# Patient Record
Sex: Female | Born: 1960 | Race: White | Hispanic: No | Marital: Married | Smoking: Former smoker
Health system: Southern US, Community
[De-identification: ages and names within clinical notes are randomized; demographics above are authoritative.]

## PROBLEM LIST (undated history)

## (undated) DIAGNOSIS — Q2730 Arteriovenous malformation, site unspecified: Secondary | ICD-10-CM

## (undated) DIAGNOSIS — R002 Palpitations: Secondary | ICD-10-CM

## (undated) DIAGNOSIS — B001 Herpesviral vesicular dermatitis: Secondary | ICD-10-CM

## (undated) DIAGNOSIS — K644 Residual hemorrhoidal skin tags: Secondary | ICD-10-CM

## (undated) DIAGNOSIS — A5131 Condyloma latum: Secondary | ICD-10-CM

## (undated) DIAGNOSIS — K589 Irritable bowel syndrome without diarrhea: Secondary | ICD-10-CM

## (undated) DIAGNOSIS — E079 Disorder of thyroid, unspecified: Secondary | ICD-10-CM

## (undated) HISTORY — DX: Palpitations: R00.2

## (undated) HISTORY — DX: Residual hemorrhoidal skin tags: K64.4

## (undated) HISTORY — DX: Arteriovenous malformation, site unspecified: Q27.30

## (undated) HISTORY — PX: HEMORRHOID SURGERY: SHX153

## (undated) HISTORY — DX: Condyloma latum: A51.31

## (undated) HISTORY — DX: Disorder of thyroid, unspecified: E07.9

---

## 2001-01-19 ENCOUNTER — Other Ambulatory Visit: Admission: RE | Admit: 2001-01-19 | Discharge: 2001-01-19 | Payer: Self-pay | Admitting: Obstetrics and Gynecology

## 2001-02-14 ENCOUNTER — Encounter (INDEPENDENT_AMBULATORY_CARE_PROVIDER_SITE_OTHER): Payer: Self-pay | Admitting: Specialist

## 2001-02-14 ENCOUNTER — Other Ambulatory Visit: Admission: RE | Admit: 2001-02-14 | Discharge: 2001-02-14 | Payer: Self-pay | Admitting: Obstetrics and Gynecology

## 2002-02-12 ENCOUNTER — Other Ambulatory Visit: Admission: RE | Admit: 2002-02-12 | Discharge: 2002-02-12 | Payer: Self-pay | Admitting: Obstetrics and Gynecology

## 2004-11-16 ENCOUNTER — Other Ambulatory Visit: Admission: RE | Admit: 2004-11-16 | Discharge: 2004-11-16 | Payer: Self-pay | Admitting: Obstetrics and Gynecology

## 2004-12-21 ENCOUNTER — Ambulatory Visit: Payer: Self-pay | Admitting: Internal Medicine

## 2004-12-28 ENCOUNTER — Ambulatory Visit: Payer: Self-pay | Admitting: Internal Medicine

## 2006-01-03 ENCOUNTER — Ambulatory Visit: Payer: Self-pay | Admitting: Internal Medicine

## 2008-10-25 HISTORY — PX: COLONOSCOPY: SHX174

## 2009-08-06 DIAGNOSIS — K5909 Other constipation: Secondary | ICD-10-CM | POA: Insufficient documentation

## 2009-08-06 DIAGNOSIS — K644 Residual hemorrhoidal skin tags: Secondary | ICD-10-CM

## 2009-08-06 DIAGNOSIS — K921 Melena: Secondary | ICD-10-CM | POA: Insufficient documentation

## 2009-08-06 HISTORY — DX: Residual hemorrhoidal skin tags: K64.4

## 2009-08-12 ENCOUNTER — Ambulatory Visit: Payer: Self-pay | Admitting: Internal Medicine

## 2009-08-18 ENCOUNTER — Ambulatory Visit: Payer: Self-pay | Admitting: Internal Medicine

## 2009-11-17 ENCOUNTER — Ambulatory Visit: Payer: Self-pay | Admitting: Internal Medicine

## 2009-11-17 LAB — CONVERTED CEMR LAB
Fecal Occult Blood: NEGATIVE
OCCULT 1: NEGATIVE
OCCULT 2: NEGATIVE
OCCULT 3: NEGATIVE
OCCULT 4: NEGATIVE
OCCULT 5: NEGATIVE

## 2010-07-28 ENCOUNTER — Encounter: Admission: RE | Admit: 2010-07-28 | Discharge: 2010-07-28 | Payer: Self-pay | Admitting: Obstetrics and Gynecology

## 2011-09-01 LAB — POC HEMOCCULT BLD/STL (HOME/3-CARD/SCREEN)

## 2011-09-20 ENCOUNTER — Ambulatory Visit (INDEPENDENT_AMBULATORY_CARE_PROVIDER_SITE_OTHER): Payer: Commercial Managed Care - PPO | Admitting: Internal Medicine

## 2011-09-20 ENCOUNTER — Encounter: Payer: Self-pay | Admitting: Internal Medicine

## 2011-09-20 DIAGNOSIS — K648 Other hemorrhoids: Secondary | ICD-10-CM

## 2011-09-20 DIAGNOSIS — K649 Unspecified hemorrhoids: Secondary | ICD-10-CM

## 2011-09-20 MED ORDER — HYDROCORTISONE ACETATE 25 MG RE SUPP: 25.0000 mg | Freq: Every day | RECTAL | Status: DC

## 2011-09-20 NOTE — Patient Instructions (Addendum)
You have been given a separate informational sheet regarding your tobacco use, the importance of quitting and local resources to help you quit.. We have sent the following medications to your pharmacy for you to pick up at your convenience: Anusol Suppositories You have been scheduled for an appointment with Dr Michaell Cowing at [facility]. Your appointment is on 10/11/11 at 9:30 am. Please arrive at 9:00 am for registration. Make certain to bring a list of current medications, including any over the counter medications or vitamins. Also bring your co-pay if you have one as well as your insurance cards. [facility] is located at [address], [address]. Should you need to reschedule your appointment, please contact them at [phone]. CC: Dr Michaell Cowing, Dr Theadora Rama

## 2011-09-20 NOTE — Progress Notes (Signed)
Stacey Green [birth year] MRN [id number]     History of Present Illness:  This is a 50 year old white female with rectal irritation and chronic constipation. She normally takes MiraLax 17 g daily and fiber supplements. Her last colonoscopy in October 2010 for Hemoccult-positive stool showed external and internal hemorrhoids. She also had a questionable AVM in the cecum which was ablated. She describes skin tags outside of the rectum which bother her and get irritated. She was also noted to have rectal spasm. She would like a referral to surgery.   Past Medical History  Diagnosis Date  . Hemorrhoids   . Perianal condyloma latum   . AVM (arteriovenous malformation)     of cecum   No past surgical history on file.  reports that she has been smoking Cigarettes.  She has been smoking about 1 pack per day. She has never used smokeless tobacco. She reports that she drinks alcohol. She reports that she does not use illicit drugs. family history includes Alcohol abuse in her brother and Irritable bowel syndrome in her mother and sister.  There is no history of Colon cancer. Allergies  Allergen Reactions  . Sulfonamide Derivatives         Review of Systems: Denies abdominal pain except when constipated. Denies heartburn shortness of breath chest pain  The remainder of the 10 point ROS is negative except as outlined in H&P   Physical Exam: General appearance  Well developed, in no distress. Eyes- non icteric. HEENT nontraumatic, normocephalic. Mouth no lesions, tongue papillated, no cheilosis. Neck supple without adenopathy, thyroid not enlarged, no carotid bruits, no JVD. Lungs Clear to auscultation bilaterally. Cor normal S1, normal S2, regular rhythm, no murmur,  quiet precordium. Abdomen: Soft metal rings in her belly button. Normal active bowel sounds. No tenderness. Rectal: anoscopic exam reveals condyloma externally, also some skin tags, tight rectal sphincter tone. Little  discomfort with insertion of the anoscope. First grade internal hemorrhoids appear to be edematous and hyperemic. Stool is Hemoccult negative. Extremities no pedal edema. Skin no lesions. Neurological alert and oriented x 3. Psychological normal mood and affect.  Assessment and Plan:  Problem #1 Patient seems to have several problems, one is chronic constipation. This is being managed with MiraLax. Another problem is increased rectal sphincter tone. This was noted on a previous exam 3 years ago. She has external hemorrhoidal tags as well. We will start her on Anusol suppositories I will make a referral to surgery for evaluation. She may need a silver nitrate treatment. She will continue MiraLax and fiber supplements.  Problem #2 rectal condyloma. This may have to be treated with silver   Internal hemorrhoids. These are the small and could be handled with Anusol-HC suppositories problem #3 09/20/2011 Stacey Green

## 2011-10-11 ENCOUNTER — Encounter (INDEPENDENT_AMBULATORY_CARE_PROVIDER_SITE_OTHER): Payer: Self-pay | Admitting: Surgery

## 2011-10-11 ENCOUNTER — Ambulatory Visit (INDEPENDENT_AMBULATORY_CARE_PROVIDER_SITE_OTHER): Payer: Commercial Managed Care - PPO | Admitting: Surgery

## 2011-10-11 VITALS — BP 118/84 | HR 64 | Temp 97.7°F | Resp 16 | Ht 63.0 in | Wt 111.0 lb

## 2011-10-11 DIAGNOSIS — F17200 Nicotine dependence, unspecified, uncomplicated: Secondary | ICD-10-CM | POA: Insufficient documentation

## 2011-10-11 DIAGNOSIS — K5909 Other constipation: Secondary | ICD-10-CM

## 2011-10-11 DIAGNOSIS — A5139 Other secondary syphilis of skin: Secondary | ICD-10-CM

## 2011-10-11 DIAGNOSIS — A630 Anogenital (venereal) warts: Secondary | ICD-10-CM | POA: Insufficient documentation

## 2011-10-11 DIAGNOSIS — K649 Unspecified hemorrhoids: Secondary | ICD-10-CM | POA: Insufficient documentation

## 2011-10-11 DIAGNOSIS — A5131 Condyloma latum: Secondary | ICD-10-CM

## 2011-10-11 NOTE — Progress Notes (Signed)
Subjective:     Patient ID: Stacey Green, female   DOB: 02-Dec-1960, 50 y.o.   MRN: [id number]  HPI  Stacey Green  [birth year] [id number]  Patient Care Team: Charolett Bumpers as PCP - General (Unknown Physician Specialty) Hart Carwin, MD as Consulting Physician (Gastroenterology)  This patient is a 50 y.o.female who presents today for surgical evaluation at the request of Dr. Juanda Chance.   Reason for visit: Bothersome hemorrhoids.  Patient is a thin smoking female who's had problems with chronic constipation. She is on a bowel regimen including daily MiraLax. That helps her to have a bowel movement about every day. She had a colonoscopy 2 years ago for her Hemoccult positive stools. It showed a few colonic AV pounds. No polyps.  She recalls a history of warts 20 years ago. No history of anal sex or other anal interventions. No history of prior surgery.  Her main complaints is itching and bleeding. No severe pain with defecation.  Patient Active Problem List  Diagnoses  . HEMORRHOIDS  . CONSTIPATION, CHRONIC  . HEMOCCULT POSITIVE STOOL  . Perianal condyloma latum    Past Medical History  Diagnosis Date  . Hemorrhoids   . Perianal condyloma latum   . AVM (arteriovenous malformation)     of cecum    Past Surgical History  Procedure Date  . Colonoscopy 2010    History   Social History  . Marital Status: Married    Spouse Name: N/A    Number of Children: N/A  . Years of Education: N/A   Occupational History  . Not on file.   Social History Main Topics  . Smoking status: Current Everyday Smoker -- 0.5 packs/day    Types: Cigarettes  . Smokeless tobacco: Never Used   Comment: Counseling sheet given in exam room   . Alcohol Use: Yes     occ  . Drug Use: No  . Sexually Active: Not on file   Other Topics Concern  . Not on file   Social History Narrative  . No narrative on file    Family History  Problem Relation Age of Onset  . Alcohol abuse Brother   .  Irritable bowel syndrome Sister   . Irritable bowel syndrome Mother   . Colon cancer Neg Hx     Current outpatient prescriptions:Calcium Citrate (CITRACAL PO), Take by mouth 2 (two) times daily.  , Disp: , Rfl: ;  Calcium Polycarbophil (FIBER-LAX PO), Take by mouth daily.  , Disp: , Rfl: ;  Doxylamine Succinate, Sleep, (SLEEP AID PO), Take by mouth daily.  , Disp: , Rfl: ;  Polyethylene Glycol 3350 (MIRALAX PO), Take by mouth. As directed , Disp: , Rfl:   Allergies  Allergen Reactions  . Sulfonamide Derivatives     BP 118/84  Pulse 64  Temp(Src) 97.7 F (36.5 C) (Temporal)  Resp 16  Ht 5\' 3"  (1.6 m)  Wt 111 lb (50.349 kg)  BMI 19.66 kg/m2     Review of Systems  Constitutional: Negative for fever, chills, diaphoresis, appetite change and fatigue.  HENT: Negative for ear pain, sore throat, trouble swallowing, neck pain and ear discharge.   Eyes: Negative for photophobia, discharge and visual disturbance.  Respiratory: Negative for cough, choking, chest tightness and shortness of breath.   Cardiovascular: Negative for chest pain and palpitations.  Gastrointestinal: Positive for constipation and blood in stool. Negative for nausea, vomiting, abdominal pain, diarrhea, anal bleeding and rectal pain.  Genitourinary: Negative for dysuria,  urgency, frequency, hematuria, decreased urine volume, vaginal bleeding, vaginal discharge and difficulty urinating.  Musculoskeletal: Negative for myalgias and gait problem.  Skin: Negative for color change, pallor and rash.  Neurological: Negative for dizziness, speech difficulty, weakness and numbness.  Hematological: Negative for adenopathy.  Psychiatric/Behavioral: Negative for confusion and agitation. The patient is not nervous/anxious.        Objective:   Physical Exam  Constitutional: She is oriented to person, place, and time. She appears well-developed and well-nourished. No distress.  HENT:  Head: Normocephalic.  Mouth/Throat:  Oropharynx is clear and moist. No oropharyngeal exudate.  Eyes: Conjunctivae and EOM are normal. Pupils are equal, round, and reactive to light. No scleral icterus.  Neck: Normal range of motion. Neck supple. No tracheal deviation present.  Cardiovascular: Normal rate, regular rhythm and intact distal pulses.   Pulmonary/Chest: Effort normal and breath sounds normal. No respiratory distress. She exhibits no tenderness.  Abdominal: Soft. She exhibits no distension and no mass. There is no tenderness. Hernia confirmed negative in the right inguinal area and confirmed negative in the left inguinal area.  Genitourinary: No vaginal discharge found.       Perianal skin clean with good hygiene.  No pruritis.  No pilonidal disease.  No fissure.  No abscess/fistula.  2 ext hems with verrucous changes suspicious for warts.  Tolerates digital (and barely tolerates anoscopic) rectal exam.  Normal sphincter tone.  No rectal masses.  Int hemorrhoidal piles WNL   Musculoskeletal: Normal range of motion. She exhibits no tenderness.  Lymphadenopathy:    She has no cervical adenopathy.       Right: No inguinal adenopathy present.       Left: No inguinal adenopathy present.  Neurological: She is alert and oriented to person, place, and time. No cranial nerve deficit. She exhibits normal muscle tone. Coordination normal.  Skin: Skin is warm and dry. No rash noted. She is not diaphoretic. No erythema.  Psychiatric: She has a normal mood and affect. Her behavior is normal. Judgment and thought content normal.       Assessment:     Ext anal masses x2.  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 I suspect that her warts sitting on top of external hemorrhoids. I think she would be best served by removal of the operating room. This was also vomiting and a better examination anesthesia since she's quite sensitive. She is interested in  proceeding.  The anatomy & physiology of the anorectal region was discussed.  The pathophysiology of hemorrhoids and differential diagnosis was discussed.  Natural history risks without surgery was discussed.   I stressed the importance of a bowel regimen to have daily soft bowel movements to minimize progression of disease.  Interventions such as sclerotherapy & banding were discussed. The pathophysiology of anorectal warts and differential diagnosis was discussed.  Natural history risks without surgery was discussed such as further growth and cancer.   I stressed the importance of office follow-up to minimize recurrence & progression of disease.  Interventions such as cauterization by topical agents were discussed.  The patient's symptoms are not adequately controlled by non-operative treatments.  I feel the risks & problems of no surgery outweigh the operative risks; therefore, I recommended surgery to treat the anal warts by removal, ablation and/or cauterization.  Risks such as bleeding, infection, need for further treatment, heart attack, death, and  other risks were discussed.   I noted a good likelihood this will help address the problem.  Goals of post-operative recovery were discussed as well.  Possibility that this will not correct all symptoms was explained.  Post-operative pain, bleeding, constipation, and other problems after surgery were discussed.  We will work to minimize complications.   Educational handouts further explaining the pathology, treatment options, and bowel regimen were given as well.  Questions were answered.  The patient expresses understanding & wishes to proceed with surgery.    The patient's symptoms are not adequately controlled.  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  Therefore, I recommended examination under anaesthesia to confirm the diagnosis and treat the fistula.  I discussed techniques that may be required such as fistulotomy, ligation by LIFT  technique, and/or seton placement.  Benefits & alternatives discussed.  I noted a good likelihood this will help address the problem.  Risks such as bleeding, pain, recurrence, reoperation, heart attack, death, and other risks were discussed.      Educational handouts further explaining the pathology, treatment options, and bowel regimen were given.  The patient expressed understanding & wishes to proceed.  We will work to coordinate surgery for a mutual convenient time.     The patient's symptoms are not adequately controlled by medicines and other non-operative treatments.  I feel the risks & problems of no surgery outweigh the operative risks; therefore, I recommended surgery to treat the hemorrhoids by resection.  Risks such as bleeding, infection, need for further treatment, heart attack, death, and other risks were discussed.   I noted a good likelihood this will help address the problem.  Goals of post-operative recovery were discussed as well.  Possibility that this will not correct all symptoms was explained.  Post-operative pain, bleeding, constipation, and other problems after surgery were discussed.  We will work to minimize complications.   Educational handouts further explaining the pathology, treatment options, and bowel regimen were given as well.  Questions were answered.  The patient expresses understanding & wishes to proceed with surgery.  We talked to the patient about the dangers of smoking.  We stressed that tobacco use dramatically increases the risk of peri-operative complications such as infection, tissue necrosis leaving to problems with incision/wound and organ healing, heart attack, stroke, DVT, pulmonary embolism, and death.  We noted there are programs in our community to help stop smoking.

## 2011-10-11 NOTE — Patient Instructions (Signed)
Hemorrhoids Hemorrhoids are enlarged (dilated) veins around the rectum. There are 2 types of hemorrhoids, and the type of hemorrhoid is determined by its location. Internal hemorrhoids occur in the veins just inside the rectum.They are usually not painful, but they may bleed.However, they may poke through to the outside and become irritated and painful. External hemorrhoids involve the veins outside the anus and can be felt as a painful swelling or hard lump near the anus.They are often itchy and may crack and bleed. Sometimes clots will form in the veins. This makes them swollen and painful. These are called thrombosed hemorrhoids. CAUSES Causes of hemorrhoids include:  Pregnancy. This increases the pressure in the hemorrhoidal veins.   Constipation.   Straining to have a bowel movement.   Obesity.   Heavy lifting or other activity that caused you to strain.  TREATMENT Most of the time hemorrhoids improve in 1 to 2 weeks. However, if symptoms do not seem to be getting better or if you have a lot of rectal bleeding, your caregiver may perform a procedure to help make the hemorrhoids get smaller or remove them completely.Possible treatments include:  Rubber band ligation. A rubber band is placed at the base of the hemorrhoid to cut off the circulation.   Sclerotherapy. A chemical is injected to shrink the hemorrhoid.   Infrared light therapy. Tools are used to burn the hemorrhoid.   Hemorrhoidectomy. This is surgical removal of the hemorrhoid.  HOME CARE INSTRUCTIONS   Increase fiber in your diet. Ask your caregiver about using fiber supplements.   Drink enough water and fluids to keep your urine clear or pale yellow.   Exercise regularly.   Go to the bathroom when you have the urge to have a bowel movement. Do not wait.   Avoid straining to have bowel movements.   Keep the anal area dry and clean.   Only take over-the-counter or prescription medicines for pain, discomfort,  or fever as directed by your caregiver.  If your hemorrhoids are thrombosed:  Take warm sitz baths for 20 to 30 minutes, 3 to 4 times per day.   If the hemorrhoids are very tender and swollen, place ice packs on the area as tolerated. Using ice packs between sitz baths may be helpful. Fill a plastic bag with ice. Place a towel between the bag of ice and your skin.   Medicated creams and suppositories may be used or applied as directed.   Do not use a donut-shaped pillow or sit on the toilet for long periods. This increases blood pooling and pain.  SEEK MEDICAL CARE IF:   You have increasing pain and swelling that is not controlled with your medicine.   You have uncontrolled bleeding.   You have difficulty or you are unable to have a bowel movement.   You have pain or inflammation outside the area of the hemorrhoids.   You have chills or an oral temperature above 102 F (38.9 C).  MAKE SURE YOU:   Understand these instructions.   Will watch your condition.   Will get help right away if you are not doing well or get worse.  Document Released: 10/08/2000 Document Revised: 06/23/2011 Document Reviewed: 02/13/2008 [facility] Patient Information 2012 [place], [place].  Smoking Cessation, Tips for Success YOU CAN QUIT SMOKING If you are ready to quit smoking, congratulations! You have chosen to help yourself be healthier. Cigarettes bring nicotine, tar, carbon monoxide, and other irritants into your body. Your lungs, heart, and blood vessels will  be able to work better without these poisons. There are many different ways to quit smoking. Nicotine gum, nicotine patches, a nicotine inhaler, or nicotine nasal spray can help with physical craving. Hypnosis, support groups, and medicines help break the habit of smoking. Here are some tips to help you quit for good.  Throw away all cigarettes.   Clean and remove all ashtrays from your home, work, and car.   On a card, write down your reasons  for quitting. Carry the card with you and read it when you get the urge to smoke.   Cleanse your body of nicotine. Drink enough water and fluids to keep your urine clear or pale yellow. Do this after quitting to flush the nicotine from your body.   Learn to predict your moods. Do not let a bad situation be your excuse to have a cigarette. Some situations in your life might tempt you into wanting a cigarette.   Never have "just one" cigarette. It leads to wanting another and another. Remind yourself of your decision to quit.   Change habits associated with smoking. If you smoked while driving or when feeling stressed, try other activities to replace smoking. Stand up when drinking your coffee. Brush your teeth after eating. Sit in a different chair when you read the paper. Avoid alcohol while trying to quit, and try to drink fewer caffeinated beverages. Alcohol and caffeine may urge you to smoke.   Avoid foods and drinks that can trigger a desire to smoke, such as sugary or spicy foods and alcohol.   Ask people who smoke not to smoke around you.   Have something planned to do right after eating or having a cup of coffee. Take a walk or exercise to perk you up. This will help to keep you from overeating.   Try a relaxation exercise to calm you down and decrease your stress. Remember, you may be tense and nervous for the first 2 weeks after you quit, but this will pass.   Find new activities to keep your hands busy. Play with a pen, coin, or rubber band. Doodle or draw things on paper.   Brush your teeth right after eating. This will help cut down on the craving for the taste of tobacco after meals. You can try mouthwash, too.   Use oral substitutes, such as lemon drops, carrots, a cinnamon stick, or chewing gum, in place of cigarettes. Keep them handy so they are available when you have the urge to smoke.   When you have the urge to smoke, try deep breathing.   Designate your home as a  nonsmoking area.   If you are a heavy smoker, ask your caregiver about a prescription for nicotine chewing gum. It can ease your withdrawal from nicotine.   Reward yourself. Set aside the cigarette money you save and buy yourself something nice.   Look for support from others. Join a support group or smoking cessation program. Ask someone at home or at work to help you with your plan to quit smoking.   Always ask yourself, "Do I need this cigarette or is this just a reflex?" Tell yourself, "Today, I choose not to smoke," or "I do not want to smoke." You are reminding yourself of your decision to quit, even if you do smoke a cigarette.  HOW WILL I FEEL WHEN I QUIT SMOKING?  The benefits of not smoking start within days of quitting.   You may have symptoms of withdrawal because  your body is used to nicotine (the addictive substance in cigarettes). You may crave cigarettes, be irritable, feel very hungry, cough often, get headaches, or have difficulty concentrating.   The withdrawal symptoms are only temporary. They are strongest when you first quit but will go away within 10 to 14 days.   When withdrawal symptoms occur, stay in control. Think about your reasons for quitting. Remind yourself that these are signs that your body is healing and getting used to being without cigarettes.   Remember that withdrawal symptoms are easier to treat than the major diseases that smoking can cause.   Even after the withdrawal is over, expect periodic urges to smoke. However, these cravings are generally short-lived and will go away whether you smoke or not. Do not smoke!   If you relapse and smoke again, do not lose hope. Most smokers quit 3 times before they are successful.   If you relapse, do not give up! Plan ahead and think about what you will do the next time you get the urge to smoke.  LIFE AS A NONSMOKER: MAKE IT FOR A MONTH, MAKE IT FOR LIFE Day 1: Hang this page where you will see it every  day. Day 2: Get rid of all ashtrays, matches, and lighters. Day 3: Drink water. Breathe deeply between sips. Day 4: Avoid places with smoke-filled air, such as bars, clubs, or the smoking section of restaurants. Day 5: Keep track of how much money you save by not smoking. Day 6: Avoid boredom. Keep a good book with you or go to the movies. Day 7: Reward yourself! One week without smoking! Day 8: Make a dental appointment to get your teeth cleaned. Day 9: Decide how you will turn down a cigarette before it is offered to you. Day 10: Review your reasons for quitting. Day 11: Distract yourself. Stay active to keep your mind off smoking and to relieve tension. Take a walk, exercise, read a book, do a crossword puzzle, or try a new hobby. Day 12: Exercise. Get off the bus before your stop or use stairs instead of escalators. Day 13: Call on friends for support and encouragement. Day 14: Reward yourself! Two weeks without smoking! Day 15: Practice deep breathing exercises. Day 16: Bet a friend that you can stay a nonsmoker. Day 17: Ask to sit in nonsmoking sections of restaurants. Day 18: Hang up "No Smoking" signs. Day 19: Think of yourself as a nonsmoker. Day 20: Each morning, tell yourself you will not smoke. Day 21: Reward yourself! Three weeks without smoking! Day 22: Think of smoking in negative ways. Remember how it stains your teeth, gives you bad breath, and leaves you short of breath. Day 23: Eat a nutritious breakfast. Day 24:Do not relive your days as a smoker. Day 25: Hold a pencil in your hand when talking on the telephone. Day 26: Tell all your friends you do not smoke. Day 27: Think about how much better food tastes. Day 28: Remember, one cigarette is one too many. Day 29: Take up a hobby that will keep your hands busy. Day 30: Congratulations! One month without smoking! Give yourself a big reward. Your caregiver can direct you to community resources or hospitals for support,  which may include:  Group support.   Education.   Hypnosis.   Subliminal therapy.  Document Released: 07/09/2004 Document Revised: 06/23/2011 Document Reviewed: 07/28/2009 [facility] Patient Information 2012 [place], [place].

## 2011-11-15 ENCOUNTER — Encounter (HOSPITAL_COMMUNITY): Payer: Self-pay | Admitting: Pharmacy Technician

## 2011-11-22 ENCOUNTER — Encounter (HOSPITAL_COMMUNITY)
Admission: RE | Admit: 2011-11-22 | Discharge: 2011-11-22 | Disposition: A | Discharge status: Home / Self Care | Payer: Commercial Managed Care - PPO | Source: Ambulatory Visit | Attending: Surgery | Admitting: Surgery

## 2011-11-22 ENCOUNTER — Encounter (HOSPITAL_COMMUNITY): Payer: Self-pay

## 2011-11-22 DIAGNOSIS — K649 Unspecified hemorrhoids: Secondary | ICD-10-CM

## 2011-11-22 HISTORY — DX: Herpesviral vesicular dermatitis: B00.1

## 2011-11-22 LAB — CBC
HCT: 36.4 % (ref 36.0–46.0)
Hemoglobin: 12.2 g/dL (ref 12.0–15.0)
MCH: 29.5 pg (ref 26.0–34.0)
MCHC: 33.5 g/dL (ref 30.0–36.0)
MCV: 88.1 fL (ref 78.0–100.0)
Platelets: 159 10*3/uL (ref 150–400)
RBC: 4.13 MIL/uL (ref 3.87–5.11)
RDW: 12.7 % (ref 11.5–15.5)
WBC: 3.7 10*3/uL — ABNORMAL LOW (ref 4.0–10.5)

## 2011-11-22 LAB — SURGICAL PCR SCREEN
MRSA, PCR: NEGATIVE
Staphylococcus aureus: NEGATIVE

## 2011-11-22 NOTE — Patient Instructions (Signed)
20 Stacey Green  11/22/2011   Your procedure is scheduled on:  11/26/11 [id number] am  Report to [facility] at 0545 AM.  Call this number if you have problems the morning of surgery: [phone]   Remember:   Do not eat food:After Midnight.  May have clear liquids:until Midnight .  Clear liquids include soda, tea, black coffee, apple or grape juice, broth.  Take these medicines the morning of surgery with A SIP OF WATER:    Do not wear jewelry, make-up or nail polish.  Do not wear lotions, powders, or perfumes.   Do not shave 48 hours prior to surgery.  Do not bring valuables to the hospital.  Contacts, dentures or bridgework may not be worn into surgery.   .   Patients discharged the day of surgery will not be allowed to drive home.  Name and phone number of your driver:   Special Instructions: CHG Shower Use Special Wash: 1/2 bottle night before surgery and 1/2 bottle morning of surgery. shower chin to toes with CHG.  Wash face and private parts with regular soap.     Please read over the following fact sheets that you were given: MRSA Information, coughing and deep breathing exercises, leg exercises

## 2011-11-22 NOTE — Pre-Procedure Instructions (Signed)
11/22/11 pt called and informed nurse of valacyclovir and dose. Nurse added to Home Medications.

## 2011-11-26 ENCOUNTER — Ambulatory Visit (HOSPITAL_COMMUNITY)
Admission: RE | Admit: 2011-11-26 | Discharge: 2011-11-26 | Disposition: A | Discharge status: Home / Self Care | Payer: Commercial Managed Care - PPO | Source: Ambulatory Visit | Attending: Surgery | Admitting: Surgery

## 2011-11-26 ENCOUNTER — Ambulatory Visit (HOSPITAL_COMMUNITY): Payer: Commercial Managed Care - PPO | Admitting: Certified Registered Nurse Anesthetist

## 2011-11-26 ENCOUNTER — Other Ambulatory Visit (INDEPENDENT_AMBULATORY_CARE_PROVIDER_SITE_OTHER): Payer: Self-pay | Admitting: Surgery

## 2011-11-26 ENCOUNTER — Encounter (HOSPITAL_COMMUNITY): Payer: Self-pay | Admitting: *Deleted

## 2011-11-26 ENCOUNTER — Encounter (HOSPITAL_COMMUNITY): Payer: Self-pay | Admitting: Certified Registered Nurse Anesthetist

## 2011-11-26 ENCOUNTER — Encounter (HOSPITAL_COMMUNITY)
Admission: RE | Disposition: A | Discharge status: Home / Self Care | Payer: Self-pay | Source: Ambulatory Visit | Attending: Surgery

## 2011-11-26 DIAGNOSIS — K649 Unspecified hemorrhoids: Secondary | ICD-10-CM

## 2011-11-26 DIAGNOSIS — K648 Other hemorrhoids: Secondary | ICD-10-CM | POA: Insufficient documentation

## 2011-11-26 DIAGNOSIS — Q828 Other specified congenital malformations of skin: Secondary | ICD-10-CM

## 2011-11-26 DIAGNOSIS — K5900 Constipation, unspecified: Secondary | ICD-10-CM | POA: Insufficient documentation

## 2011-11-26 DIAGNOSIS — K921 Melena: Secondary | ICD-10-CM | POA: Insufficient documentation

## 2011-11-26 DIAGNOSIS — B079 Viral wart, unspecified: Secondary | ICD-10-CM

## 2011-11-26 DIAGNOSIS — A630 Anogenital (venereal) warts: Secondary | ICD-10-CM | POA: Insufficient documentation

## 2011-11-26 SURGERY — EXAM UNDER ANESTHESIA WITH HEMORRHOIDECTOMY
Anesthesia: General | Site: Anus | Wound class: Clean Contaminated

## 2011-11-26 MED ORDER — PROMETHAZINE HCL 25 MG/ML IJ SOLN: 6.2500 mg | INTRAMUSCULAR | Status: DC | PRN

## 2011-11-26 MED ORDER — BUPIVACAINE-EPINEPHRINE 0.25% -1:200000 IJ SOLN
INTRAMUSCULAR | Status: AC
  Filled 2011-11-26: qty 1

## 2011-11-26 MED ORDER — HYDROCORTISONE ACE-PRAMOXINE 2.5-1 % RE CREA: TOPICAL_CREAM | Freq: Four times a day (QID) | RECTAL | Status: AC

## 2011-11-26 MED ORDER — FENTANYL CITRATE 0.05 MG/ML IJ SOLN
INTRAMUSCULAR | Status: AC
  Filled 2011-11-26: qty 2

## 2011-11-26 MED ORDER — ACETAMINOPHEN 325 MG PO TABS: 650.0000 mg | ORAL_TABLET | ORAL | Status: DC | PRN

## 2011-11-26 MED ORDER — NEOSTIGMINE METHYLSULFATE 1 MG/ML IJ SOLN
INTRAMUSCULAR | Status: DC | PRN
  Administered 2011-11-26: 2.5 mg via INTRAVENOUS

## 2011-11-26 MED ORDER — MIDAZOLAM HCL 5 MG/5ML IJ SOLN
INTRAMUSCULAR | Status: DC | PRN
  Administered 2011-11-26: 2 mg via INTRAVENOUS

## 2011-11-26 MED ORDER — HYALURONIDASE OVINE 200 UNIT/ML IJ SOLN
INTRAMUSCULAR | Status: AC
  Filled 2011-11-26: qty 1.2

## 2011-11-26 MED ORDER — PROPOFOL 10 MG/ML IV BOLUS
INTRAVENOUS | Status: DC | PRN
  Administered 2011-11-26: 160 mg via INTRAVENOUS

## 2011-11-26 MED ORDER — ACETAMINOPHEN 650 MG RE SUPP
650.0000 mg | RECTAL | Status: DC | PRN
  Filled 2011-11-26: qty 1

## 2011-11-26 MED ORDER — PROMETHAZINE HCL 25 MG/ML IJ SOLN: 12.5000 mg | Freq: Four times a day (QID) | INTRAMUSCULAR | Status: DC | PRN

## 2011-11-26 MED ORDER — MEPERIDINE HCL 50 MG/ML IJ SOLN: 6.2500 mg | INTRAMUSCULAR | Status: DC | PRN

## 2011-11-26 MED ORDER — SODIUM CHLORIDE 0.9 % IJ SOLN: 3.0000 mL | INTRAMUSCULAR | Status: DC | PRN

## 2011-11-26 MED ORDER — FENTANYL CITRATE 0.05 MG/ML IJ SOLN
INTRAMUSCULAR | Status: DC | PRN
  Administered 2011-11-26 (×2): 50 ug via INTRAVENOUS
  Administered 2011-11-26: 100 ug via INTRAVENOUS
  Administered 2011-11-26: 50 ug via INTRAVENOUS

## 2011-11-26 MED ORDER — FENTANYL CITRATE 0.05 MG/ML IJ SOLN: 25.0000 ug | INTRAMUSCULAR | Status: DC | PRN

## 2011-11-26 MED ORDER — DEXTROSE 5 % IV SOLN
1.0000 g | INTRAVENOUS | Status: AC
  Administered 2011-11-26: 1 g via INTRAVENOUS

## 2011-11-26 MED ORDER — OXYCODONE HCL 5 MG PO TABS
5.0000 mg | ORAL_TABLET | ORAL | Status: DC | PRN
  Administered 2011-11-26: 5 mg via ORAL

## 2011-11-26 MED ORDER — CEFOXITIN SODIUM-DEXTROSE 1-4 GM-% IV SOLR (PREMIX)
INTRAVENOUS | Status: AC
  Filled 2011-11-26: qty 50

## 2011-11-26 MED ORDER — SODIUM CHLORIDE 0.9 % IV SOLN: 250.0000 mL | INTRAVENOUS | Status: DC | PRN

## 2011-11-26 MED ORDER — SODIUM CHLORIDE 0.9 % IJ SOLN: 3.0000 mL | Freq: Two times a day (BID) | INTRAMUSCULAR | Status: DC

## 2011-11-26 MED ORDER — GLYCOPYRROLATE 0.2 MG/ML IJ SOLN
INTRAMUSCULAR | Status: DC | PRN
  Administered 2011-11-26: .5 mg via INTRAVENOUS

## 2011-11-26 MED ORDER — SODIUM CHLORIDE 0.9 % IJ SOLN
INTRAMUSCULAR | Status: DC | PRN
  Administered 2011-11-26: 10 mL via INTRAVENOUS

## 2011-11-26 MED ORDER — ONDANSETRON HCL 4 MG/2ML IJ SOLN
INTRAMUSCULAR | Status: DC | PRN
  Administered 2011-11-26: 4 mg via INTRAVENOUS

## 2011-11-26 MED ORDER — LACTATED RINGERS IV SOLN: INTRAVENOUS | Status: DC

## 2011-11-26 MED ORDER — BUPIVACAINE LIPOSOME 1.3 % IJ SUSP
20.0000 mL | INTRAMUSCULAR | Status: AC
  Administered 2011-11-26: 20 mL
  Filled 2011-11-26: qty 20

## 2011-11-26 MED ORDER — FENTANYL CITRATE 0.05 MG/ML IJ SOLN
25.0000 ug | INTRAMUSCULAR | Status: DC | PRN
  Administered 2011-11-26 (×2): 50 ug via INTRAVENOUS

## 2011-11-26 MED ORDER — ONDANSETRON HCL 4 MG/2ML IJ SOLN: 4.0000 mg | Freq: Four times a day (QID) | INTRAMUSCULAR | Status: DC | PRN

## 2011-11-26 MED ORDER — CISATRACURIUM BESYLATE 2 MG/ML IV SOLN
INTRAVENOUS | Status: DC | PRN
  Administered 2011-11-26: 4 mg via INTRAVENOUS

## 2011-11-26 MED ORDER — DIBUCAINE 1 % RE OINT
TOPICAL_OINTMENT | RECTAL | Status: AC
  Filled 2011-11-26: qty 28

## 2011-11-26 MED ORDER — OXYCODONE HCL 5 MG PO TABS: 5.0000 mg | ORAL_TABLET | ORAL | Status: AC | PRN

## 2011-11-26 MED ORDER — LACTATED RINGERS IV SOLN
INTRAVENOUS | Status: DC | PRN
  Administered 2011-11-26 (×2): via INTRAVENOUS

## 2011-11-26 MED ORDER — OXYCODONE HCL 5 MG PO TABS
ORAL_TABLET | ORAL | Status: AC
  Filled 2011-11-26: qty 1

## 2011-11-26 SURGICAL SUPPLY — 35 items
BLADE HEX COATED 2.75 (ELECTRODE) ×2 IMPLANT
BLADE SURG 15 STRL LF DISP TIS (BLADE) ×1 IMPLANT
BLADE SURG 15 STRL SS (BLADE) ×1
CANISTER SUCTION 2500CC (MISCELLANEOUS) ×2 IMPLANT
CLOTH BEACON ORANGE TIMEOUT ST (SAFETY) ×2 IMPLANT
DECANTER SPIKE VIAL GLASS SM (MISCELLANEOUS) IMPLANT
DRAPE LAPAROTOMY T 102X78X121 (DRAPES) ×2 IMPLANT
DRAPE LG THREE QUARTER DISP (DRAPES) IMPLANT
DRAPE TABLE BACK 44X90 PK DISP (DRAPES) ×2 IMPLANT
DRSG PAD ABDOMINAL 8X10 ST (GAUZE/BANDAGES/DRESSINGS) ×2 IMPLANT
ELECT REM PT RETURN 9FT ADLT (ELECTROSURGICAL) ×2
ELECTRODE REM PT RTRN 9FT ADLT (ELECTROSURGICAL) ×1 IMPLANT
GAUZE SPONGE 4X4 16PLY XRAY LF (GAUZE/BANDAGES/DRESSINGS) ×2 IMPLANT
GLOVE BIOGEL PI IND STRL 7.0 (GLOVE) ×1 IMPLANT
GLOVE BIOGEL PI INDICATOR 7.0 (GLOVE) ×1
GLOVE ECLIPSE 8.0 STRL XLNG CF (GLOVE) ×2 IMPLANT
GLOVE INDICATOR 8.0 STRL GRN (GLOVE) ×2 IMPLANT
GOWN STRL NON-REIN LRG LVL3 (GOWN DISPOSABLE) ×2 IMPLANT
GOWN STRL REIN XL XLG (GOWN DISPOSABLE) ×4 IMPLANT
HEMOSTAT SURGICEL 2X14 (HEMOSTASIS) IMPLANT
KIT BASIN OR (CUSTOM PROCEDURE TRAY) ×2 IMPLANT
LUBRICANT JELLY K Y 4OZ (MISCELLANEOUS) ×2 IMPLANT
NDL SAFETY ECLIPSE 18X1.5 (NEEDLE) IMPLANT
NEEDLE HYPO 18GX1.5 SHARP (NEEDLE)
NEEDLE HYPO 22GX1.5 SAFETY (NEEDLE) ×2 IMPLANT
NS IRRIG 1000ML POUR BTL (IV SOLUTION) ×2 IMPLANT
PENCIL BUTTON HOLSTER BLD 10FT (ELECTRODE) ×2 IMPLANT
SPONGE GAUZE 4X4 12PLY (GAUZE/BANDAGES/DRESSINGS) ×2 IMPLANT
SPONGE SURGIFOAM ABS GEL 12-7 (HEMOSTASIS) ×2 IMPLANT
SUT CHROMIC 2 0 SH (SUTURE) IMPLANT
SUT CHROMIC 3 0 SH 27 (SUTURE) IMPLANT
SUT VIC AB 2-0 UR6 27 (SUTURE) ×2 IMPLANT
SYR CONTROL 10ML LL (SYRINGE) ×2 IMPLANT
TOWEL OR 17X26 10 PK STRL BLUE (TOWEL DISPOSABLE) ×2 IMPLANT
YANKAUER SUCT BULB TIP 10FT TU (MISCELLANEOUS) ×2 IMPLANT

## 2011-11-26 NOTE — Op Note (Signed)
NAME:  Stacey Green, Stacey Green NO.:  [id number]  MEDICAL RECORD NO.:  [id number]  LOCATION:  [facility]                         FACILITY:  [facility]  PHYSICIAN:  Ardeth Sportsman, MD     DATE OF BIRTH:  [birth year]  DATE OF PROCEDURE:  11/26/2011 DATE OF DISCHARGE:                              OPERATIVE REPORT   PRIMARY CARE PHYSICIAN:  Theadora Rama, MD.  GASTROENTEROLOGIST:  Hedwig Morton. Juanda Chance, MD.  SURGEON:  Ardeth Sportsman, MD.  ASSISTANT:  R.N.  PREOPERATIVE DIAGNOSES: 1. External perianal masses, skin tags, erythema, warts x2. 2. Hemorrhoids.  POSTOPERATIVE DIAGNOSES: 1. 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 2. Right posterior prolapsing internal hemorrhoid.  PROCEDURE PERFORMED: 1. Examination under anesthesia. 2. Removal of perianal skin masses (probable warts). 3. Right posterior hemorrhoidal ligation and suture hemorrhoidopexy.  ANESTHESIA: 1. General anesthesia. 2. Anorectal field block using liposomal bupivacaine.  SPECIMENS:  Anal masses x2.  DRAINS:  None.  ESTIMATED BLOOD LOSS:  Minimal.  COMPLICATIONS:  None apparent.  INDICATIONS:  Stacey Green is a 51 year old thin female who struggles with irritable bowel syndrome with chronic constipation.  She struggled with hemorrhoid issues and has had interventions annually for the past 2 years.  She has been more compliant on a bowel regimen with the help of Dr. Juanda Chance.  She is now having softer bowel movements, but she still has problems with the hemorrhoid irritation.  I saw 2 perianal masses suspicious for anal warts.  She has a distant history of anal warts in the past.  Recommendation made for examination under anesthesia with removal of the masses and possible hemorrhoidal removal and/or ligation.  TECHNIQUE:  Risks, benefits, alternatives discussed.  Questions answered and she agreed to proceed.  OPERATIVE FINDINGS:  She had left anterior and right anterior pedunculated skin masses  with some verrucous changes suspicious for anal warts.  Her right posterior hemorrhoid had evidence of prolapse and inflammation.  The other 2 hemorrhoidal piles did not seem particularly enlarged.  DESCRIPTION OF PROCEDURE:  Informed consent was confirmed.  Patient underwent general anesthesia without any difficulty.  She was positioned prone.  Her perianal region was prepped and draped in sterile fashion. Surgical time-out confirmed our plan.  I did gentle digital rectal examination and dilated up to allow an anoscope review.  Her prep was poor.  Eventually, we were able to clean her rectal stool out. Examination over the right posterior inflamed hemorrhoid with some prolapse.  The perianal skin masses were noted.  I did not see any other rectal abnormalities.  I proceeded with removal of the pedunculated skin masses by elevating and transecting the stalk with a scalpel.  The masses were left anterior, right anterior at basically 10 o'clock and 2 o'clock.  I cauterized the base, the base did well.  I did not put stitches.  Because the right posterior hemorrhoid had injections and interventions in the past, I decided to do hemorrhoidal ligation.  I did ligations at right posterior and right lateral, about 6 cm from the anal verge with a figure-of-eight stitch and then ran it down to the anorectal line and tied that down.  There was still some  prolapse, so I ran one down taking up to the internal hemorrhoid on the right posterior aspect since it had an external component as well.  After tying that down, that helped reduce the hemorrhoid size down and help pexy it up well.  Rest of the piles looked normal.  Hemostasis was good.  We placed a Gelfoam cylinder into the rectal vault.  Patient is being extubated to go to recovery room.  I have discussed postop bowel regimen again with her in detail prior to surgery and discussed with her husband as well.  We will follow  her closely.     Ardeth Sportsman, MD     SCG/MEDQ  D:  11/26/2011  T:  11/26/2011  Job:  [id number]  cc:   Theadora Rama, MD Fax: [phone]  Hedwig Morton. Juanda Chance, MD [address] [place] [address]

## 2011-11-26 NOTE — Anesthesia Postprocedure Evaluation (Signed)
  Anesthesia Post-op Note  Patient: Stacey Green  Procedure(s) Performed:  EXAM UNDER ANESTHESIA WITH HEMORRHOIDECTOMY  Patient Location: PACU  Anesthesia Type: General  Level of Consciousness: awake and alert   Airway and Oxygen Therapy: Patient Spontanous Breathing  Post-op Pain: mild  Post-op Assessment: Post-op Vital signs reviewed, Patient's Cardiovascular Status Stable, Respiratory Function Stable, Patent Airway and No signs of Nausea or vomiting  Post-op Vital Signs: stable  Complications: No apparent anesthesia complications

## 2011-11-26 NOTE — Progress Notes (Signed)
11-25-11 Took Zutripro  5 ml at 1900 for cold

## 2011-11-26 NOTE — Anesthesia Procedure Notes (Signed)
Procedure Name: Intubation Date/Time: 11/26/2011 7:36 AM Performed by: Hulan Fess Pre-anesthesia Checklist: Patient identified, Emergency Drugs available, Suction available, Patient being monitored and Timeout performed Patient Re-evaluated:Patient Re-evaluated prior to inductionOxygen Delivery Method: Circle System Utilized Preoxygenation: Pre-oxygenation with 100% oxygen Intubation Type: IV induction Ventilation: Mask ventilation without difficulty Laryngoscope Size: Mac and 3 Grade View: Grade I Tube type: Oral Tube size: 8.0 mm Number of attempts: 1 Placement Confirmation: ETT inserted through vocal cords under direct vision Secured at: 22 cm Tube secured with: Tape Dental Injury: Teeth and Oropharynx as per pre-operative assessment

## 2011-11-26 NOTE — Anesthesia Preprocedure Evaluation (Addendum)
Anesthesia Evaluation  Patient identified by MRN, date of birth, ID band Patient awake    Reviewed: Allergy & Precautions, H&P , NPO status , Patient's Chart, lab work & pertinent test results  Airway Mallampati: III TM Distance: >3 FB Neck ROM: Full    Dental No notable dental hx.    Pulmonary neg pulmonary ROS, Current Smoker,  clear to auscultation  Pulmonary exam normal       Cardiovascular neg cardio ROS Regular Normal    Neuro/Psych Negative Neurological ROS  Negative Psych ROS   GI/Hepatic negative GI ROS, Neg liver ROS,   Endo/Other  Negative Endocrine ROS  Renal/GU negative Renal ROS  Genitourinary negative   Musculoskeletal negative musculoskeletal ROS (+)   Abdominal   Peds negative pediatric ROS (+)  Hematology negative hematology ROS (+)   Anesthesia Other Findings   Reproductive/Obstetrics negative OB ROS                         Anesthesia Physical Anesthesia Plan  ASA: II  Anesthesia Plan: General   Post-op Pain Management:    Induction: Intravenous  Airway Management Planned:   Additional Equipment:   Intra-op Plan:   Post-operative Plan: Extubation in OR  Informed Consent: I have reviewed the patients History and Physical, chart, labs and discussed the procedure including the risks, benefits and alternatives for the proposed anesthesia with the patient or authorized representative who has indicated his/her understanding and acceptance.   Dental advisory given  Plan Discussed with: CRNA  Anesthesia Plan Comments:        Anesthesia Quick Evaluation

## 2011-11-26 NOTE — H&P (Signed)
Stacey Green  Nov 07, 1960 [id number]  Patient Care Team: Charolett Bumpers as PCP - General (Unknown Physician Specialty) Hart Carwin, MD as Consulting Physician (Gastroenterology)  This patient is a 51 y.o.female who presents today for surgical evaluation at the request of Dr. Juanda Chance  for Bothersome hemorrhoids.   She is a thin smoking female who's had problems with chronic constipation. She is on a bowel regimen including daily MiraLax. That helps her to have a bowel movement about every day. She had a colonoscopy 2 years ago for her Hemoccult positive stools. It showed a few colonic AV pounds. No polyps.   She recalls a history of warts 20 years ago. No history of anal sex or other anal interventions. No history of prior surgery.   Her main complaints is itching and bleeding. No severe pain with defecation.  No new events since last visit.   Patient Active Problem List  Diagnoses  . Hemorrhoids, external with pain, bleeding  . CONSTIPATION, CHRONIC  . HEMOCCULT POSITIVE STOOL  . Perianal condyloma latum  . Tobacco dependence    Past Medical History  Diagnosis Date  . Hemorrhoids   . Perianal condyloma latum   . AVM (arteriovenous malformation)     of cecum  . Cold sore     pt woke up am of 11/22/11 went to MD on 11/22/11     Past Surgical History  Procedure Date  . Colonoscopy 2010    History   Social History  . Marital Status: Married    Spouse Name: N/A    Number of Children: N/A  . Years of Education: N/A   Occupational History  . Not on file.   Social History Main Topics  . Smoking status: Current Everyday Smoker -- 0.2 packs/day for 25 years    Types: Cigarettes  . Smokeless tobacco: Never Used   Comment: Counseling sheet given in exam room   . Alcohol Use: Yes     occ  . Drug Use: No     hx of marijuana none current   . Sexually Active: Not on file   Other Topics Concern  . Not on file   Social History Narrative  . No narrative on file     Family History  Problem Relation Age of Onset  . Alcohol abuse Brother   . Irritable bowel syndrome Sister   . Irritable bowel syndrome Mother   . Colon cancer Neg Hx     Current facility-administered medications:bupivacaine liposome (EXPAREL) 1.3 % injection 266 mg, 20 mL, Infiltration, To OR, Ardeth Sportsman, MD;  cefOXitin (MEFOXIN) 1 g in dextrose 5 % 50 mL IVPB, 1 g, Intravenous, 60 min Pre-Op, Ardeth Sportsman, MD Facility-Administered Medications Ordered in Other Encounters: lactated ringers infusion, , , Continuous PRN, Konette K. Wall, CRNA  Allergies  Allergen Reactions  . Sulfonamide Derivatives Rash    BP 122/88  Pulse 98  Temp(Src) 98.1 F (36.7 C) (Oral)  Resp 18  SpO2 98%     Review of Systems  Constitutional: Negative for fever, chills, diaphoresis, appetite change and fatigue.  HENT: Negative for ear pain, sore throat, trouble swallowing, neck pain and ear discharge.  Eyes: Negative for photophobia, discharge and visual disturbance.  Respiratory: Negative for cough, choking, chest tightness and shortness of breath.  Cardiovascular: Negative for chest pain and palpitations.  Gastrointestinal: Positive for constipation and blood in stool. Negative for nausea, vomiting, abdominal pain, diarrhea, anal bleeding and rectal pain.  Genitourinary: Negative for  dysuria, urgency, frequency, hematuria, decreased urine volume, vaginal bleeding, vaginal discharge and difficulty urinating.  Musculoskeletal: Negative for myalgias and gait problem.  Skin: Negative for color change, pallor and rash.  Neurological: Negative for dizziness, speech difficulty, weakness and numbness.  Hematological: Negative for adenopathy.  Psychiatric/Behavioral: Negative for confusion and agitation. The patient is not nervous/anxious.    Objective:   Physical Exam  Constitutional: She is oriented to person, place, and time. She appears well-developed and well-nourished. No distress.   HENT:  Head: Normocephalic.  Mouth/Throat: Oropharynx is clear and moist. No oropharyngeal exudate.  Eyes: Conjunctivae and EOM are normal. Pupils are equal, round, and reactive to light. No scleral icterus.  Neck: Normal range of motion. Neck supple. No tracheal deviation present.  Cardiovascular: Normal rate, regular rhythm and intact distal pulses.  Pulmonary/Chest: Effort normal and breath sounds normal. No respiratory distress. She exhibits no tenderness.  Abdominal: Soft. She exhibits no distension and no mass. There is no tenderness. Hernia confirmed negative in the right inguinal area and confirmed negative in the left inguinal area.  Genitourinary: No vaginal discharge found.  Perianal skin clean with good hygiene. No pruritis. No pilonidal disease. No fissure. No abscess/fistula.   2 ext hems with verrucous changes suspicious for warts.  Musculoskeletal: Normal range of motion. She exhibits no tenderness.  Lymphadenopathy:  She has no cervical adenopathy.  Right: No inguinal adenopathy present.  Left: No inguinal adenopathy present.  Neurological: She is alert and oriented to person, place, and time. No cranial nerve deficit. She exhibits normal muscle tone. Coordination normal.  Skin: Skin is warm and dry. No rash noted. She is not diaphoretic. No erythema.  Psychiatric: She has a normal mood and affect. Her behavior is normal. Judgment and thought content normal.    Assessment:    Ext anal masses x2. Prob warts on top of ext hemorrhoids   Plan:    Given the location and size, I don't think these can be treated topically. I suspect that her warts sitting on top of external hemorrhoids. I think she would be best served by removal of the operating room. This will allow a better examination anesthesia since she's quite sensitive. She is interested in proceeding.   The anatomy & physiology of the anorectal region was discussed. The pathophysiology of hemorrhoids and  differential diagnosis was discussed. Natural history risks without surgery was discussed. I stressed the importance of a bowel regimen to have daily soft bowel movements to minimize progression of disease. Interventions such as sclerotherapy & banding were discussed.  The pathophysiology of anorectal warts and differential diagnosis was discussed. Natural history risks without surgery was discussed such as further growth and cancer. I stressed the importance of office follow-up to minimize recurrence & progression of disease. Interventions such as cauterization by topical agents were discussed.    The patient's symptoms are not adequately controlled by non-operative treatments. I feel the risks & problems of no surgery outweigh the operative risks; therefore, I recommended surgery to treat the anal warts by removal, ablation and/or cauterization.  Risks such as bleeding, infection, need for further treatment, heart attack, death, and other risks were discussed. I noted a good likelihood this will help address the problem. Goals of post-operative recovery were discussed as well. Possibility that this will not correct all symptoms was explained. Post-operative pain, bleeding, constipation, and other problems after surgery were discussed. We will work to minimize complications. Educational handouts further explaining the pathology, treatment options, and bowel regimen were  given as well. Questions were answered. The patient expresses understanding & wishes to proceed with surgery.   We talked to the patient about the dangers of smoking. We stressed that tobacco use dramatically increases the risk of peri-operative complications such as infection, tissue necrosis leaving to problems with incision/wound and organ healing, heart attack, stroke, DVT, pulmonary embolism, and death. We noted there are programs in our community to help stop smoking.   I have re-reviewed the the patient's records, history,  medications, and allergies.  I have re-examined the patient.  I again discussed intraoperative plans and goals of post-operative recovery.  The patient agrees to proceed.

## 2011-11-26 NOTE — Brief Op Note (Signed)
11/26/2011  8:22 AM  PATIENT:  Stacey Green  51 y.o. female  Patient Care Team: Charolett Bumpers as PCP - General (Unknown Physician Specialty) Hart Carwin, MD as Consulting Physician (Gastroenterology)  PRE-OPERATIVE DIAGNOSIS:  external hemorrhoids/tags times two   POST-OPERATIVE DIAGNOSIS:    external hemorrhoidal tags, probable anal warts x2 Prolapsing R post hemorrhoid  PROCEDURE:  Procedure(s): EXAM UNDER ANESTHESIA Removal of anal mass x 2 Right posterior hemorrhoidal ligation.pexy  SURGEON:  Surgeon(s): Ardeth Sportsman, MD  PHYSICIAN ASSISTANT:   ASSISTANTS: none   ANESTHESIA:   regional and general  EBL:  Total I/O In: 1000 [I.V.:1000] Out: -   BLOOD ADMINISTERED:none  DRAINS: none   LOCAL MEDICATIONS USED:  OTHER Liposomal bupivicaine for anorectal block  SPECIMEN:  Source of Specimen:  anal masses (probable warts)  DISPOSITION OF SPECIMEN:  PATHOLOGY  COUNTS:  YES  TOURNIQUET:  * No tourniquets in log *  DICTATION: .Other Dictation: Dictation Number [id number]  PLAN OF CARE: Discharge to home after PACU  PATIENT DISPOSITION:  PACU - hemodynamically stable.   Delay start of Pharmacological VTE agent (>24hrs) due to surgical blood loss or risk of bleeding:  {YES/NO/NOT APPLICABLE:20182

## 2011-11-26 NOTE — Transfer of Care (Signed)
Immediate Anesthesia Transfer of Care Note  Patient: Stacey Green  Procedure(s) Performed:  EXAM UNDER ANESTHESIA WITH HEMORRHOIDECTOMY  Patient Location: PACU  Anesthesia Type: General  Level of Consciousness: awake, alert  and oriented  Airway & Oxygen Therapy: Patient Spontanous Breathing and Patient connected to face mask oxygen  Post-op Assessment: Report given to PACU RN  Post vital signs: Reviewed and stable  Complications: No apparent anesthesia complications

## 2011-11-27 ENCOUNTER — Emergency Department (HOSPITAL_COMMUNITY)
Admission: EM | Admit: 2011-11-27 | Discharge: 2011-11-27 | Disposition: A | Discharge status: Home / Self Care | Payer: Commercial Managed Care - PPO | Attending: Emergency Medicine | Admitting: Emergency Medicine

## 2011-11-27 ENCOUNTER — Encounter (HOSPITAL_COMMUNITY): Payer: Self-pay | Admitting: *Deleted

## 2011-11-27 ENCOUNTER — Emergency Department (HOSPITAL_COMMUNITY): Payer: Commercial Managed Care - PPO

## 2011-11-27 DIAGNOSIS — Z9889 Other specified postprocedural states: Secondary | ICD-10-CM | POA: Insufficient documentation

## 2011-11-27 DIAGNOSIS — K6289 Other specified diseases of anus and rectum: Secondary | ICD-10-CM | POA: Insufficient documentation

## 2011-11-27 DIAGNOSIS — K5900 Constipation, unspecified: Secondary | ICD-10-CM | POA: Insufficient documentation

## 2011-11-27 DIAGNOSIS — F17200 Nicotine dependence, unspecified, uncomplicated: Secondary | ICD-10-CM | POA: Insufficient documentation

## 2011-11-27 HISTORY — DX: Irritable bowel syndrome, unspecified: K58.9

## 2011-11-27 MED ORDER — DOCUSATE SODIUM 100 MG PO CAPS: 100.0000 mg | ORAL_CAPSULE | Freq: Two times a day (BID) | ORAL | Status: AC

## 2011-11-27 MED ORDER — SODIUM CHLORIDE 0.9 % IV BOLUS (SEPSIS)
1000.0000 mL | Freq: Once | INTRAVENOUS | Status: AC
  Administered 2011-11-27: 1000 mL via INTRAVENOUS

## 2011-11-27 MED ORDER — GLYCERIN (LAXATIVE) 2.1 G RE SUPP
1.0000 | Freq: Once | RECTAL | Status: DC
  Filled 2011-11-27: qty 1

## 2011-11-27 MED ORDER — DOCUSATE SODIUM 100 MG PO CAPS
100.0000 mg | ORAL_CAPSULE | Freq: Once | ORAL | Status: AC
  Administered 2011-11-27: 100 mg via ORAL
  Filled 2011-11-27: qty 1

## 2011-11-27 MED ORDER — FLEET ENEMA 7-19 GM/118ML RE ENEM: 1.0000 | ENEMA | Freq: Once | RECTAL | Status: DC

## 2011-11-27 NOTE — ED Notes (Signed)
Pt had hemorrhoidectomy yesterday morning. Pt having abdominal pain which she relates to constipation. Abdominal cramping, nausea, denies vomiting.

## 2011-11-27 NOTE — ED Notes (Signed)
Patient provided with package of mesh panties, peri-cleanser and 1 peri-pad at this time.

## 2011-11-27 NOTE — ED Notes (Signed)
Patient c/o progressive, severe lower ABD cramping that began yesterday. Patient reports having a hemmrhoidectomy done on 2/1 early AM and discharged with pain medicine and stool softeners. Reports taking 4 doses of Miralax and 1 bottle of Magnesium Citrate during the day today with the last dose of Mag. Citrate consumed at 1900 approx with no results. Patient remains in the fetal position during assessment for comfort.

## 2011-11-27 NOTE — ED Provider Notes (Signed)
History     CSN: [id number]  Arrival date & time 11/27/11  0027   First MD Initiated Contact with Patient 11/27/11 0115      Chief Complaint  Patient presents with  . Constipation    s/p hemorrhoidectomy 11/26/11    (Consider location/radiation/quality/duration/timing/severity/associated sxs/prior treatment) HPI Patient is an emergency department following hemorrhoidectomy yesterday morning.  She states she has not had a bowel movement in the last 2 days.  She is complaining of abdominal cramping nausea and rectal pain.  She denies chest pain, shortness of breath, weakness, vomiting, headache, or visual changes.  She states that she was given pain medications take by mouth and told to use MiraLAX by mouth.  Patient states that she drank 2 bottles of magnesium citrate for arrival tonight. Past Medical History  Diagnosis Date  . Hemorrhoids   . Perianal condyloma latum   . AVM (arteriovenous malformation)     of cecum  . Cold sore     pt woke up am of 11/22/11 went to MD on 11/22/11   . IBS (irritable bowel syndrome)     Past Surgical History  Procedure Date  . Colonoscopy 2010  . Hemorrhoid surgery     Family History  Problem Relation Age of Onset  . Alcohol abuse Brother   . Irritable bowel syndrome Sister   . Irritable bowel syndrome Mother   . Colon cancer Neg Hx     History  Substance Use Topics  . Smoking status: Current Everyday Smoker -- 0.2 packs/day for 25 years    Types: Cigarettes  . Smokeless tobacco: Never Used   Comment: Counseling sheet given in exam room   . Alcohol Use: Yes     occ    OB History    Grav Para Term Preterm Abortions TAB SAB Ect Mult Living                  Review of Systems ROS All pertinent positives and negatives reviewed in the history of present illness  Allergies  Sulfonamide derivatives  Home Medications   Current Outpatient Rx  Name Route Sig Dispense Refill  . AZITHROMYCIN 250 MG PO TABS Oral Take 250 mg by mouth  daily.    Marland Kitchen CITRACAL PO Oral Take 1 tablet by mouth 2 (two) times daily.     Marland Kitchen FIBER-LAX PO Oral Take 1 tablet by mouth daily.     . OXYCODONE HCL 5 MG PO TABS Oral Take 1-2 tablets (5-10 mg total) by mouth every 4 (four) hours as needed for pain. 50 tablet 0  . MIRALAX PO Oral Take 17 g by mouth daily. As directed    . VALACYCLOVIR HCL 1 G PO TABS Oral Take 1,000 mg by mouth 2 (two) times daily. 2 tablets twice a day for a total of 4 tablets Start date 11/22/11    . HYDROCORTISONE ACE-PRAMOXINE 2.5-1 % RE CREA Rectal Place rectally 4 (four) times daily. For irritated and painful hemorrhoids 15 g 2    BP 113/82  Pulse 93  Temp(Src) 97.9 F (36.6 C) (Oral)  Resp 20  SpO2 96%  Physical Exam  Constitutional: She appears well-developed and well-nourished. She appears distressed.  HENT:  Head: Normocephalic and atraumatic.  Cardiovascular: Normal rate, regular rhythm and normal heart sounds.   Pulmonary/Chest: Effort normal and breath sounds normal.  Abdominal: Soft. She exhibits no distension. Bowel sounds are increased. There is tenderness. There is no rebound and no guarding.    ED  Course  Procedures (including critical care time)  Patient has had multiple large bowel movements will here in the emergency department.  She does not want to have any testing done or further medication other than stool softener.  Patient states she feels much better following these episodes.  Patient will be advised to use the pain medication sparingly.  She is told to increase her fluid intake and she is given IV fluids here for hydration purposes.  She is told to return here as needed for any worsening in her condition.        MDM  See above comments        Carlyle Dolly, PA-C 11/27/11 [phone]

## 2011-11-27 NOTE — ED Provider Notes (Signed)
Medical screening examination/treatment/procedure(s) were performed by non-physician practitioner and as supervising physician I was immediately available for consultation/collaboration.  Flint Melter, MD 11/27/11 [phone]

## 2011-12-14 ENCOUNTER — Encounter (INDEPENDENT_AMBULATORY_CARE_PROVIDER_SITE_OTHER): Payer: Commercial Managed Care - PPO | Admitting: Surgery

## 2011-12-20 ENCOUNTER — Encounter (INDEPENDENT_AMBULATORY_CARE_PROVIDER_SITE_OTHER): Payer: Commercial Managed Care - PPO | Admitting: Surgery

## 2012-01-10 ENCOUNTER — Encounter (INDEPENDENT_AMBULATORY_CARE_PROVIDER_SITE_OTHER): Payer: Self-pay | Admitting: Surgery

## 2012-01-10 ENCOUNTER — Ambulatory Visit (INDEPENDENT_AMBULATORY_CARE_PROVIDER_SITE_OTHER): Payer: Commercial Managed Care - PPO | Admitting: Surgery

## 2012-01-10 VITALS — BP 116/78 | HR 76 | Temp 97.9°F | Resp 12 | Ht 63.0 in | Wt 108.4 lb

## 2012-01-10 DIAGNOSIS — K644 Residual hemorrhoidal skin tags: Secondary | ICD-10-CM

## 2012-01-10 DIAGNOSIS — A630 Anogenital (venereal) warts: Secondary | ICD-10-CM

## 2012-01-10 NOTE — Patient Instructions (Signed)
Anal Warts Genital warts are caused by a germ (human papillomavirus, HPV). This germ is spread by having unprotected sex (intercourse) with an infected person. It can be spread by vaginal, anal, and oral sex. A person who is infected might not show any signs or problems. HOME CARE  Follow your doctor's treatment instructions.   Do not use medicine that is meant for hand warts. Only take medicine as told by your doctor.   Tell your past and current sex partner(s) that you have genital warts. They may need treatment.   Avoid sexual contact while you are being treated.   Do not touch or scratch the warts. You could spread it to other parts of your body.   Women with genital warts should have a cervical cancer check (Pap test) at least once a year.   Tell your doctor if you become pregnant. The germ can be passed to the baby.   After treatment, use a condom during sex.   Ask your doctor before using anti-itch creams.  GET HELP RIGHT AWAY IF:   Your treated skin becomes red, puffy (swollen), or painful.   You have a fever.   You feel generally sick.   You feel little lumps in and around your genital area.   You are bleeding or have pain during sex.  MAKE SURE YOU:   Understand these instructions.   Will watch your condition.   Will get help right away if you are not doing well or get worse.  Document Released: 01/05/2010 Document Revised: 09/30/2011 Document Reviewed: 04/19/2011 [facility] Patient Information 2012 [place], [place].

## 2012-01-10 NOTE — Progress Notes (Signed)
Subjective:     Patient ID: Stacey Green, female   DOB: 05-02-1961, 51 y.o.   MRN: [id number]  HPI  Stacey Green  06-08-1961 [id number]  Patient Care Team: Charolett Bumpers as PCP - General (Unknown Physician Specialty) Hart Carwin, MD as Consulting Physician (Gastroenterology)  This patient is a 51 y.o.female who presents today for surgical evaluation.   Procedure: Removal of perianal masses and ligation of internal hemorrhoid 11/26/2011  The patient comes in today feeling better. She struggled with urinary and fecal retention early. After taking mag citrate and MiraLax her pain got worse and she went to the emergency room. Then she had a large bowel movement and started to feel better.  Taking MiraLax every day. Soreness is gone down. No bleeding. No fevers or chills. Denies any new lumps or lesions. She comes today by herself  Patient Active Problem List  Diagnoses  . Hemorrhoids, external, with complication  . CONSTIPATION, CHRONIC  . HEMOCCULT POSITIVE STOOL  . Condyloma acuminatum of perianal region  . Tobacco dependence    Past Medical History  Diagnosis Date  . Hemorrhoids   . Perianal condyloma latum   . AVM (arteriovenous malformation)     of cecum  . Cold sore     pt woke up am of 11/22/11 went to MD on 11/22/11   . IBS (irritable bowel syndrome)     Past Surgical History  Procedure Date  . Colonoscopy 2010  . Hemorrhoid surgery     History   Social History  . Marital Status: Married    Spouse Name: N/A    Number of Children: N/A  . Years of Education: N/A   Occupational History  . Not on file.   Social History Main Topics  . Smoking status: Current Everyday Smoker -- 0.2 packs/day for 25 years    Types: Cigarettes  . Smokeless tobacco: Never Used   Comment: Counseling sheet given in exam room   . Alcohol Use: Yes     occ  . Drug Use: No     hx of marijuana none current   . Sexually Active: Yes    Birth Control/ Protection: None   Other  Topics Concern  . Not on file   Social History Narrative  . No narrative on file    Family History  Problem Relation Age of Onset  . Alcohol abuse Brother   . Irritable bowel syndrome Sister   . Irritable bowel syndrome Mother   . Colon cancer Neg Hx     Current outpatient prescriptions:Calcium Citrate (CITRACAL PO), Take 1 tablet by mouth 2 (two) times daily. , Disp: , Rfl: ;  Calcium Polycarbophil (FIBER-LAX PO), Take 1 tablet by mouth daily. , Disp: , Rfl: ;  Polyethylene Glycol 3350 (MIRALAX PO), Take 17 g by mouth daily. As directed, Disp: , Rfl:  valACYclovir (VALTREX) 1000 MG tablet, Take 1,000 mg by mouth 2 (two) times daily. 2 tablets twice a day for a total of 4 tablets Start date 11/22/11, Disp: , Rfl:   Allergies  Allergen Reactions  . Oxycodone Rash  . Sulfonamide Derivatives Rash    BP 116/78  Pulse 76  Temp(Src) 97.9 F (36.6 C) (Temporal)  Resp 12  Ht 5\' 3"  (1.6 m)  Wt 108 lb 6.4 oz (49.17 kg)  BMI 19.20 kg/m2     Review of Systems  Constitutional: Negative for fever, chills, diaphoresis, appetite change and fatigue.  HENT: Negative for ear pain, sore throat,  trouble swallowing, neck pain and ear discharge.   Eyes: Negative for photophobia, discharge and visual disturbance.  Respiratory: Negative for cough, choking, chest tightness and shortness of breath.   Cardiovascular: Negative for chest pain and palpitations.  Gastrointestinal: Negative for nausea, vomiting, abdominal pain, diarrhea, constipation, anal bleeding and rectal pain.  Genitourinary: Negative for dysuria, frequency and difficulty urinating.  Musculoskeletal: Negative for myalgias and gait problem.  Skin: Negative for color change, pallor and rash.  Neurological: Negative for dizziness, speech difficulty, weakness and numbness.  Hematological: Negative for adenopathy.  Psychiatric/Behavioral: Negative for confusion and agitation. The patient is not nervous/anxious.        Objective:    Physical Exam  Constitutional: She is oriented to person, place, and time. She appears well-developed and well-nourished. No distress.  HENT:  Head: Normocephalic.  Mouth/Throat: Oropharynx is clear and moist. No oropharyngeal exudate.  Eyes: Conjunctivae and EOM are normal. Pupils are equal, round, and reactive to light. No scleral icterus.  Neck: Normal range of motion. No tracheal deviation present.  Cardiovascular: Normal rate and intact distal pulses.   Pulmonary/Chest: Effort normal. No respiratory distress. She exhibits no tenderness.  Abdominal: Soft. She exhibits no distension. There is no tenderness. Hernia confirmed negative in the right inguinal area and confirmed negative in the left inguinal area.       Incisions clean with normal healing ridges.  No hernias  Genitourinary: No vaginal discharge found.       Perianal skin clean with good hygiene.  No pruritis.  No pilonidal disease.  No fissure.  No abscess/fistula.  No recurrent perianal external warts  Refuses digital and anoscopic rectal exam.  Normal sphincter tone.    Musculoskeletal: Normal range of motion. She exhibits no tenderness.  Lymphadenopathy:       Right: No inguinal adenopathy present.       Left: No inguinal adenopathy present.  Neurological: She is alert and oriented to person, place, and time. No cranial nerve deficit. She exhibits normal muscle tone. Coordination normal.  Skin: Skin is warm and dry. No rash noted. She is not diaphoretic.  Psychiatric: She has a normal mood and affect. Her behavior is normal.       Assessment:     Anal condyloma s/p excision - no evid recurrence but inadequate exam  Hemorrhoids under control     Plan:     Increase activity as tolerated.  Do not push through pain.  Advanced on diet as tolerated. Bowel regimen to avoid problems.  Avoid unprotected sex. No prior history of anal trauma or sex. It was only 2 spots and they were small, so hopefully this is all she  needs  Return to clinic q4 months for rectal exam including anoscopy for 1st year to r/o condyloma recurrence, then p.r.n. I noted that I understand self-examinations are uncomfortable, but is essential. Close followup and given a chance to catch things early and prevent another operation. She is more willing to consider this nowThe patient expressed understanding and appreciation

## 2012-05-22 ENCOUNTER — Encounter (INDEPENDENT_AMBULATORY_CARE_PROVIDER_SITE_OTHER): Payer: Self-pay | Admitting: Surgery

## 2012-05-22 ENCOUNTER — Ambulatory Visit (INDEPENDENT_AMBULATORY_CARE_PROVIDER_SITE_OTHER): Payer: Commercial Managed Care - PPO | Admitting: Surgery

## 2012-05-22 VITALS — BP 118/72 | HR 70 | Resp 18 | Ht 63.0 in | Wt 108.0 lb

## 2012-05-22 DIAGNOSIS — A630 Anogenital (venereal) warts: Secondary | ICD-10-CM

## 2012-05-22 DIAGNOSIS — K5909 Other constipation: Secondary | ICD-10-CM

## 2012-05-22 NOTE — Progress Notes (Signed)
Subjective:     Patient ID: Stacey Green, female   DOB: [birth year], 51 y.o.   MRN: [id number]  HPI   Stacey Green  [birth year] [id number]  Patient Care Team: Charolett Bumpers as PCP - General (Unknown Physician Specialty) Hart Carwin, MD as Consulting Physician (Gastroenterology)  This patient is a 51 y.o.female who presents today for surgical evaluation.   Procedure: Removal of perianal masses and ligation of internal hemorrhoid 11/26/2011 Path: Condyloma  The patient comes in today feeling better.  Taking MiraLax every day. No bleeding/irritation. No fevers or chills. Denies any new lumps or lesions. She comes today by herself  Patient Active Problem List  Diagnosis  . CONSTIPATION, CHRONIC  . HEMOCCULT POSITIVE STOOL  . Condyloma acuminatum of perianal region  . Tobacco dependence    Past Medical History  Diagnosis Date  . Hemorrhoids   . Perianal condyloma latum   . AVM (arteriovenous malformation)     of cecum  . Cold sore     pt woke up am of 11/22/11 went to MD on 11/22/11   . IBS (irritable bowel syndrome)   . Hemorrhoids, external, with complication 08/06/2009    Qualifier: Diagnosis of  By: Nelson-Smith CMA (AAMA), Dottie      Past Surgical History  Procedure Date  . Colonoscopy 2010  . Hemorrhoid surgery     History   Social History  . Marital Status: Married    Spouse Name: N/A    Number of Children: N/A  . Years of Education: N/A   Occupational History  . Not on file.   Social History Main Topics  . Smoking status: Current Everyday Smoker -- 0.2 packs/day for 25 years    Types: Cigarettes  . Smokeless tobacco: Never Used   Comment: Counseling sheet given in exam room   . Alcohol Use: Yes     occ  . Drug Use: No     hx of marijuana none current   . Sexually Active: Yes    Birth Control/ Protection: None   Other Topics Concern  . Not on file   Social History Narrative  . No narrative on file    Family History  Problem Relation  Age of Onset  . Alcohol abuse Brother   . Irritable bowel syndrome Sister   . Irritable bowel syndrome Mother   . Colon cancer Neg Hx     Current outpatient prescriptions:Calcium Citrate (CITRACAL PO), Take 1 tablet by mouth 2 (two) times daily. , Disp: , Rfl: ;  Calcium Polycarbophil (FIBER-LAX PO), Take 1 tablet by mouth daily. , Disp: , Rfl: ;  Polyethylene Glycol 3350 (MIRALAX PO), Take 17 g by mouth daily. As directed, Disp: , Rfl:  valACYclovir (VALTREX) 1000 MG tablet, Take 1,000 mg by mouth 2 (two) times daily. 2 tablets twice a day for a total of 4 tablets Start date 11/22/11, Disp: , Rfl:   Allergies  Allergen Reactions  . Oxycodone Rash  . Sulfonamide Derivatives Rash    BP 118/72  Pulse 70  Resp 18  Ht 5\' 3"  (1.6 m)  Wt 108 lb (48.988 kg)  BMI 19.13 kg/m2     Review of Systems  Constitutional: Negative for fever, chills, diaphoresis, appetite change and fatigue.  HENT: Negative for ear pain, sore throat, trouble swallowing, neck pain and ear discharge.   Eyes: Negative for photophobia, discharge and visual disturbance.  Respiratory: Negative for cough, choking, chest tightness and shortness of breath.   Cardiovascular:  Negative for chest pain and palpitations.  Gastrointestinal: Negative for nausea, vomiting, abdominal pain, diarrhea, constipation, anal bleeding and rectal pain.  Genitourinary: Negative for dysuria, frequency and difficulty urinating.  Musculoskeletal: Negative for myalgias and gait problem.  Skin: Negative for color change, pallor and rash.  Neurological: Negative for dizziness, speech difficulty, weakness and numbness.  Hematological: Negative for adenopathy.  Psychiatric/Behavioral: Negative for confusion and agitation. The patient is not nervous/anxious.        Objective:   Physical Exam  Constitutional: She is oriented to person, place, and time. She appears well-developed and well-nourished. No distress.  HENT:  Head: Normocephalic.    Mouth/Throat: Oropharynx is clear and moist. No oropharyngeal exudate.  Eyes: Conjunctivae and EOM are normal. Pupils are equal, round, and reactive to light. No scleral icterus.  Neck: Normal range of motion. No tracheal deviation present.  Cardiovascular: Normal rate and intact distal pulses.   Pulmonary/Chest: Effort normal. No respiratory distress. She exhibits no tenderness.  Abdominal: Soft. She exhibits no distension. There is no tenderness. Hernia confirmed negative in the right inguinal area and confirmed negative in the left inguinal area.       Incisions clean with normal healing ridges.  No hernias  Genitourinary: No vaginal discharge found.       Exam done with assistance of female Medical Assistant in the room.  Perianal skin clean with good hygiene.  No pruritis.  No external skin tags / hemorrhoids of significance.  No pilonidal disease.  No fissure.  No abscess/fistula.   No recurrent condylomata/ulcers/etc  Tolerates digital and anoscopic rectal exam.  Normal sphincter tone.  No rectal masses.  Hemorrhoidal piles WNL   Musculoskeletal: Normal range of motion. She exhibits no tenderness.  Lymphadenopathy:       Right: No inguinal adenopathy present.       Left: No inguinal adenopathy present.  Neurological: She is alert and oriented to person, place, and time. No cranial nerve deficit. She exhibits normal muscle tone. Coordination normal.  Skin: Skin is warm and dry. No rash noted. She is not diaphoretic.  Psychiatric: She has a normal mood and affect. Her behavior is normal.       Assessment:     Anal condyloma s/p excision - no evidence of recurrence     Plan:     Activity as tolerated.    Diet as tolerated. Bowel regimen to avoid problems.  Return to clinic q4 months for rectal exam including anoscopy for 1st year to r/o condyloma recurrence, then q70mons next year, then p.r.n.  Close followup gives [place] a chance to catch things early and prevent another  operation. The patient expressed understanding and appreciation

## 2012-05-22 NOTE — Patient Instructions (Addendum)
Surgical follow-up after resection of condyloma: -Rectal exam every 4 months 1st year after surgery -Rectal exam every 6months 2nd year -Followup as needed thereafter   Genital Warts Genital warts are a sexually transmitted infection. They may appear as small bumps on the tissues of the genital area. CAUSES  Genital warts are caused by a virus called human papillomavirus (HPV). HPV is the most common sexually transmitted disease (STD) and infection of the sex organs. This infection is spread by having unprotected sex with an infected person. It can be spread by vaginal, anal, and oral sex. Many people do not know they are infected. They may be infected for years without problems. However, even if they do not have problems, they can unknowingly pass the infection to their sexual partners. SYMPTOMS   Itching and irritation in the genital area.   Warts that bleed.   Painful sexual intercourse.  DIAGNOSIS  Warts are usually recognized with the naked eye on the vagina, vulva, perineum, anus, and rectum. Certain tests can also diagnose genital warts, such as:  A Pap test.   A tissue sample (biopsy) exam.   Colposcopy. A magnifying tool is used to examine the vagina and cervix. The HPV cells will change color when certain solutions are used.  TREATMENT  Warts can be removed by:  Applying certain chemicals, such as cantharidin or podophyllin.   Liquid nitrogen freezing (cryotherapy).   Immunotherapy with candida or trichophyton injections.   Laser treatment.   Burning with an electrified probe (electrocautery).   Interferon injections.   Surgery.  PREVENTION  HPV vaccination can help prevent HPV infections that cause genital warts and that cause cancer of the cervix. It is recommended that the vaccination be given to people between the ages 92 to 30 years old. The vaccine might not work as well or might not work at all if you already have HPV. It should not be given to pregnant  women. HOME CARE INSTRUCTIONS   It is important to follow your caregiver's instructions. The warts will not go away without treatment. Repeat treatments are often needed to get rid of warts. Even after it appears that the warts are gone, the normal tissue underneath often remains infected.   Do not try to treat genital warts with medicine used to treat hand warts. This type of medicine is strong and can burn the skin in the genital area, causing more damage.   Tell your past and current sexual partner(s) that you have genital warts. They may be infected also and need treatment.   Avoid sexual contact while being treated.   Do not touch or scratch the warts. The infection may spread to other parts of your body.   Women with genital warts should have a cervical cancer check (Pap test) at least once a year. This type of cancer is slow-growing and can be cured if found early. Chances of developing cervical cancer are increased with HPV.   Inform your obstetrician about your warts in the event of pregnancy. This virus can be passed to the baby's respiratory tract. Discuss this with your caregiver.   Use a condom during sexual intercourse. Following treatment, the use of condoms will help prevent reinfection.   Ask your caregiver about using over-the-counter anti-itch creams.  SEEK MEDICAL CARE IF:   Your treated skin becomes red, swollen, or painful.   You have a fever.   You feel generally ill.   You feel little lumps in and around your genital area.  You are bleeding or have painful sexual intercourse.  MAKE SURE YOU:   Understand these instructions.   Will watch your condition.   Will get help right away if you are not doing well or get worse.  Document Released: 10/08/2000 Document Revised: 09/30/2011 Document Reviewed: 04/19/2011 [facility] Patient Information 2012 [place], [place].

## 2012-07-24 ENCOUNTER — Other Ambulatory Visit: Payer: Self-pay | Admitting: Dermatology

## 2012-12-22 ENCOUNTER — Other Ambulatory Visit: Payer: Self-pay | Admitting: Dermatology

## 2014-11-18 ENCOUNTER — Other Ambulatory Visit: Payer: Self-pay | Admitting: Obstetrics and Gynecology

## 2014-11-21 LAB — CYTOLOGY - PAP

## 2015-04-22 ENCOUNTER — Other Ambulatory Visit (INDEPENDENT_AMBULATORY_CARE_PROVIDER_SITE_OTHER): Payer: 59

## 2015-04-22 ENCOUNTER — Encounter: Payer: Self-pay | Admitting: Internal Medicine

## 2015-04-22 ENCOUNTER — Ambulatory Visit (INDEPENDENT_AMBULATORY_CARE_PROVIDER_SITE_OTHER): Payer: 59 | Admitting: Internal Medicine

## 2015-04-22 VITALS — BP 94/70 | HR 72 | Ht 63.0 in | Wt 109.0 lb

## 2015-04-22 DIAGNOSIS — K648 Other hemorrhoids: Secondary | ICD-10-CM | POA: Diagnosis not present

## 2015-04-22 DIAGNOSIS — R195 Other fecal abnormalities: Secondary | ICD-10-CM

## 2015-04-22 LAB — CBC WITH DIFFERENTIAL/PLATELET
BASOS ABS: 0 10*3/uL (ref 0.0–0.1)
Basophils Relative: 0.5 % (ref 0.0–3.0)
EOS ABS: 0.1 10*3/uL (ref 0.0–0.7)
Eosinophils Relative: 1.1 % (ref 0.0–5.0)
HEMATOCRIT: 39.6 % (ref 36.0–46.0)
HEMOGLOBIN: 13.3 g/dL (ref 12.0–15.0)
LYMPHS ABS: 1.7 10*3/uL (ref 0.7–4.0)
Lymphocytes Relative: 31.5 % (ref 12.0–46.0)
MCHC: 33.6 g/dL (ref 30.0–36.0)
MCV: 88.9 fl (ref 78.0–100.0)
Monocytes Absolute: 0.4 10*3/uL (ref 0.1–1.0)
Monocytes Relative: 6.9 % (ref 3.0–12.0)
NEUTROS ABS: 3.2 10*3/uL (ref 1.4–7.7)
Neutrophils Relative %: 60 % (ref 43.0–77.0)
Platelets: 228 10*3/uL (ref 150.0–400.0)
RBC: 4.46 Mil/uL (ref 3.87–5.11)
RDW: 12.9 % (ref 11.5–15.5)
WBC: 5.4 10*3/uL (ref 4.0–10.5)

## 2015-04-22 LAB — IRON AND TIBC
%SAT: 27 % (ref 20–55)
Iron: 93 ug/dL (ref 42–145)
TIBC: 345 ug/dL (ref 250–470)
UIBC: 252 ug/dL (ref 125–400)

## 2015-04-22 MED ORDER — NA SULFATE-K SULFATE-MG SULF 17.5-3.13-1.6 GM/177ML PO SOLN: ORAL | Status: DC

## 2015-04-22 MED ORDER — HYDROCORTISONE ACETATE 25 MG RE SUPP: 25.0000 mg | Freq: Every day | RECTAL | Status: DC

## 2015-04-22 NOTE — Progress Notes (Signed)
Stacey Green 04-16-1961 [id number]  Note: This dictation was prepared with Dragon digital system. Any transcriptional errors that result from this procedure are unintentional.   History of Present Illness: This is a 54 year old white female referred by Dr. Renaldo FiddlerAdkins for heme positive stool on home test. 1 out of 3 samples were positive for blood. Patient denies visible blood per rectum. She has been asymptomatic. Her bowel habits are regular on MiraLAX 17 g daily. She denies abdominal pain, weight loss. Heartburn. She took an Aleve one day prior to the  home stool test and she attributes the heme positive stool to that. There is no family history of colon cancer. She was seen by Dr. Michaell CowingGross in 2013 for excision of perineal condyloma and ligation of internal hemorrhoids. She has not had a recurrence of this problem. Last colonoscopy in October 2010 for heme  positive stool  Revealed internal/external hemorrhoids as well as AVMs in the cecum which was ablated on a subsequent colonoscopy.   Past Medical History  Diagnosis Date  . Hemorrhoids   . Perianal condyloma latum   . AVM (arteriovenous malformation)     of cecum  . Cold sore     pt woke up am of 11/22/11 went to MD on 11/22/11   . IBS (irritable bowel syndrome)   . Hemorrhoids, external, with complication 08/06/2009    Qualifier: Diagnosis of  By: Nelson-Smith CMA (AAMA), Dottie      Past Surgical History  Procedure Laterality Date  . Colonoscopy  2010  . Hemorrhoid surgery      Allergies  Allergen Reactions  . Oxycodone Rash  . Sulfonamide Derivatives Rash    Family history and social history have been reviewed.  Review of Systems: Denies diarrhea, constipation, abdominal pain or weight loss  The remainder of the 10 point ROS is negative except as outlined in the H&P  Physical Exam: General Appearance Well developed, in no distress Eyes  Non icteric  HEENT  Non traumatic, normocephalic  Mouth No lesion, tongue papillated,  no cheilosis Neck Supple without adenopathy, thyroid not enlarged, no carotid bruits, no JVD Lungs Clear to auscultation bilaterally COR Normal S1, normal S2, regular rhythm, no murmur, quiet precordium Abdomen soft nontender with normoactive bowel sounds. No distention. No tympany. Liver edge at costal margin Rectal and anoscopic exam reveals normal perianal area. Normal rectal sphincter tone. First grade internal hemorrhoids with hyperemia and edema. Patient was uncomfortable during the exam. There is no fissure. There is some bright red blood on the ano scope. Stool is Hemoccult-positive Extremities  No pedal edema Skin No lesions Neurological Alert and oriented x 3 Psychological Normal mood and affect  Assessment and Plan:   54 year old white female with heme positive stool on home test which was reproduced on today's anoscopic exam. She has internal hemorrhoids and there was some bright red blood on the anoscope indicating anal source of bleeding. She also has a history of cecal AVM which was ablated 6 years ago. I'm not clear whether her heme positive stool is due to anal source or if there is an additional problem in her colon. We are going to check CBC and iron levels today. We will treat internal hemorrhoids with hydrocortisone suppository 25 mg daily. We will schedule colonoscopy to look for additional source of bleeding. This was discussed with the patient and she agrees to proceed    Stacey Green 04/22/2015

## 2015-04-22 NOTE — Patient Instructions (Addendum)
We have sent medications to your pharmacy for you to pick up at your convenience.  You have been scheduled for a colonoscopy. Please follow written instructions given to you at your visit today.  Please pick up your prep supplies at the pharmacy within the next 1-3 days. If you use inhalers (even only as needed), please bring them with you on the day of your procedure. Your physician has requested that you go to www.startemmi.com and enter the access code given to you at your visit today. This web site gives a general overview about your procedure. However, you should still follow specific instructions given to you by our office regarding your preparation for the procedure.  Your physician has requested that you go to the basement for the following lab work before leaving today:  Dr Zelphia CairoGretchen Adkins, Dr Theadora Ramaichard Keever

## 2015-04-29 ENCOUNTER — Ambulatory Visit (AMBULATORY_SURGERY_CENTER): Payer: 59 | Admitting: Internal Medicine

## 2015-04-29 ENCOUNTER — Encounter: Payer: Self-pay | Admitting: Internal Medicine

## 2015-04-29 VITALS — BP 124/77 | HR 59 | Temp 97.6°F | Resp 30 | Ht 63.0 in | Wt 109.0 lb

## 2015-04-29 DIAGNOSIS — K921 Melena: Secondary | ICD-10-CM | POA: Diagnosis not present

## 2015-04-29 DIAGNOSIS — R195 Other fecal abnormalities: Secondary | ICD-10-CM | POA: Diagnosis not present

## 2015-04-29 DIAGNOSIS — K635 Polyp of colon: Secondary | ICD-10-CM | POA: Diagnosis not present

## 2015-04-29 MED ORDER — SODIUM CHLORIDE 0.9 % IV SOLN: 500.0000 mL | INTRAVENOUS | Status: DC

## 2015-04-29 NOTE — Patient Instructions (Signed)
YOU HAD AN ENDOSCOPIC PROCEDURE TODAY AT THE [facility]:   Refer to the procedure report that was given to you for any specific questions about what was found during the examination.  If the procedure report does not answer your questions, please call your gastroenterologist to clarify.  If you requested that your care partner not be given the details of your procedure findings, then the procedure report has been included in a sealed envelope for you to review at your convenience later.  YOU SHOULD EXPECT: Some feelings of bloating in the abdomen. Passage of more gas than usual.  Walking can help get rid of the air that was put into your GI tract during the procedure and reduce the bloating. If you had a lower endoscopy (such as a colonoscopy or flexible sigmoidoscopy) you may notice spotting of blood in your stool or on the toilet paper. If you underwent a bowel prep for your procedure, you may not have a normal bowel movement for a few days.  Please Note:  You might notice some irritation and congestion in your nose or some drainage.  This is from the oxygen used during your procedure.  There is no need for concern and it should clear up in a day or so.  SYMPTOMS TO REPORT IMMEDIATELY:   Following lower endoscopy (colonoscopy or flexible sigmoidoscopy):  Excessive amounts of blood in the stool  Significant tenderness or worsening of abdominal pains  Swelling of the abdomen that is new, acute  Fever of 100F or higher  For urgent or emergent issues, a gastroenterologist can be reached at any hour by calling [phone].   DIET: Your first meal following the procedure should be a small meal and then it is ok to progress to your normal diet. Heavy or fried foods are harder to digest and may make you feel nauseous or bloated.  Likewise, meals heavy in dairy and vegetables can increase bloating.  Drink plenty of fluids but you should avoid alcoholic beverages for 24  hours.  ACTIVITY:  You should plan to take it easy for the rest of today and you should NOT DRIVE or use heavy machinery until tomorrow (because of the sedation medicines used during the test).    FOLLOW UP: Our staff will call the number listed on your records the next business day following your procedure to check on you and address any questions or concerns that you may have regarding the information given to you following your procedure. If we do not reach you, we will leave a message.  However, if you are feeling well and you are not experiencing any problems, there is no need to return our call.  We will assume that you have returned to your regular daily activities without incident.  If any biopsies were taken you will be contacted by phone or by letter within the next 1-3 weeks.  Please call us at [phone] if you have not heard about the biopsies in 3 weeks.    SIGNATURES/CONFIDENTIALITY: You and/or your care partner have signed paperwork which will be entered into your electronic medical record.  These signatures attest to the fact that that the information above on your After Visit Summary has been reviewed and is understood.  Full responsibility of the confidentiality of this discharge information lies with you and/or your care-partner.  Continue hydrocortisone 25mg . Suppositories daily at bedtime.

## 2015-04-29 NOTE — Progress Notes (Signed)
A/ox3 pleased with MAC, report to Jane RN 

## 2015-04-29 NOTE — Op Note (Addendum)
[facility] [address] [place], [id number]   COLONOSCOPY PROCEDURE REPORT  PATIENT: Stacey Green, Stacey Green  MR#: #[id number] BIRTHDATE: 04/20/1961 , 54  yrs. old GENDER: female ENDOSCOPIST: Hart Carwinora M Polo Mcmartin, MD REFERRED XB:[id number]:Gretchen Renaldo FiddlerAdkins, M.D.,DR Theadora Ramaichard Keever PROCEDURE DATE:  04/29/2015 PROCEDURE:   Colonoscopy, diagnostic and Colonoscopy with biopsy First Screening Colonoscopy - Avg.  risk and is 50 yrs.  old or older - No.  Prior Negative Screening - Now for repeat screening. Less than 10 yrs Prior Negative Screening - Now for repeat screening.  Other: See Comments  History of Adenoma - Now for follow-up colonoscopy & has been > or = to 3 yrs.  N/A  Polyps removed today? No Recommend repeat exam, <10 yrs? No ASA CLASS:   Class II INDICATIONS:Evaluation of unexplained GI bleeding, Colorectal Neoplasm Risk Assessment for this procedure is average risk, and heme positive stool on home test. .  Prior colonoscopy  in October 2010. MEDICATIONS: Monitored anesthesia care and Propofol 160 mg IV  DESCRIPTION OF PROCEDURE:   After the risks benefits and alternatives of the procedure were thoroughly explained, informed consent was obtained.  The digital rectal exam revealed no abnormalities of the rectum.   The LB [id number]  endoscope was introduced through the anus and advanced to the cecum, which was identified by both the appendix and ileocecal valve. No adverse events experienced.   The quality of the prep was good.  (MoviPrep was used)  The instrument was then slowly withdrawn as the colon was fully examined. Estimated blood loss is zero unless otherwise noted in this procedure report.      COLON FINDINGS: A circumferential patch of abnormal mucosa was found in the rectum.  The mucosa was congested and erythematous.  A biopsy of the area was performed using cold forceps.  Retroflexed views revealed no abnormalities. The time to cecum = 5.43 Withdrawal time  = 7.14   The scope was withdrawn and the procedure completed. COMPLICATIONS: There were no immediate complications.  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  Await pathology results 2.  Continue hydrocortisone 25 mg suppositories daily at bedtime Hemoccult-positive stool was likely related to presence of follow-up proctitis  eSigned:  Hart Carwinora M Grettell Ransdell, MD 04/29/2015 10:38 AM Revised: 04/29/2015 10:38 AM  cc:   PATIENT NAME:  Stacey Green, Stacey Green MR#: #[id number]

## 2015-04-29 NOTE — Progress Notes (Signed)
Called to room to assist during endoscopic procedure.  Patient ID and intended procedure confirmed with present staff. Received instructions for my participation in the procedure from the performing physician.  

## 2015-04-30 ENCOUNTER — Telehealth: Payer: Self-pay | Admitting: *Deleted

## 2015-04-30 NOTE — Telephone Encounter (Signed)
  Follow up Call-  Call back number 04/29/2015  Post procedure Call Back phone  # [phone] cell  Permission to leave phone message Yes     Patient questions:  Do you have a fever, pain , or abdominal swelling? No. Pain Score  0 *  Have you tolerated food without any problems? Yes.    Have you been able to return to your normal activities? Yes.    Do you have any questions about your discharge instructions: Diet   No. Medications  No. Follow up visit  No.  Do you have questions or concerns about your Care? No.  Actions: * If pain score is 4 or above: No action needed, pain <4. Patient stating she did have a "little blood" last evening, none this am. Informed to call with rectal bleeding, clots or frank blood greater than or equal to 1/4 cup. Patient agreed.

## 2015-05-06 ENCOUNTER — Encounter: Payer: Self-pay | Admitting: Internal Medicine

## 2015-05-13 ENCOUNTER — Telehealth: Payer: Self-pay | Admitting: Internal Medicine

## 2015-05-13 NOTE — Telephone Encounter (Signed)
Patient has not gotten the letter mailed with results. Gave her results and mailed out letter again.

## 2015-05-28 ENCOUNTER — Encounter: Payer: 59 | Admitting: Internal Medicine

## 2015-11-24 ENCOUNTER — Ambulatory Visit
Admission: RE | Admit: 2015-11-24 | Discharge: 2015-11-24 | Disposition: A | Discharge status: Home / Self Care | Payer: PRIVATE HEALTH INSURANCE | Source: Ambulatory Visit | Attending: Family Medicine | Admitting: Family Medicine

## 2015-11-24 ENCOUNTER — Other Ambulatory Visit: Payer: Self-pay | Admitting: Family Medicine

## 2015-11-24 DIAGNOSIS — M545 Low back pain: Secondary | ICD-10-CM

## 2016-01-14 ENCOUNTER — Encounter: Payer: Self-pay | Admitting: Internal Medicine

## 2017-07-18 DIAGNOSIS — E039 Hypothyroidism, unspecified: Secondary | ICD-10-CM | POA: Diagnosis not present

## 2017-09-26 DIAGNOSIS — H524 Presbyopia: Secondary | ICD-10-CM | POA: Diagnosis not present

## 2017-11-16 DIAGNOSIS — J069 Acute upper respiratory infection, unspecified: Secondary | ICD-10-CM | POA: Diagnosis not present

## 2017-12-19 DIAGNOSIS — Z01419 Encounter for gynecological examination (general) (routine) without abnormal findings: Secondary | ICD-10-CM | POA: Diagnosis not present

## 2017-12-19 DIAGNOSIS — Z1231 Encounter for screening mammogram for malignant neoplasm of breast: Secondary | ICD-10-CM | POA: Diagnosis not present

## 2017-12-19 DIAGNOSIS — Z13820 Encounter for screening for osteoporosis: Secondary | ICD-10-CM | POA: Diagnosis not present

## 2017-12-19 DIAGNOSIS — Z681 Body mass index (BMI) 19 or less, adult: Secondary | ICD-10-CM | POA: Diagnosis not present

## 2018-01-16 DIAGNOSIS — M8580 Other specified disorders of bone density and structure, unspecified site: Secondary | ICD-10-CM | POA: Diagnosis not present

## 2018-02-20 DIAGNOSIS — Z85828 Personal history of other malignant neoplasm of skin: Secondary | ICD-10-CM | POA: Diagnosis not present

## 2018-02-20 DIAGNOSIS — D2239 Melanocytic nevi of other parts of face: Secondary | ICD-10-CM | POA: Diagnosis not present

## 2018-02-20 DIAGNOSIS — H0265 Xanthelasma of left lower eyelid: Secondary | ICD-10-CM | POA: Diagnosis not present

## 2018-02-20 DIAGNOSIS — H0262 Xanthelasma of right lower eyelid: Secondary | ICD-10-CM | POA: Diagnosis not present

## 2018-04-10 DIAGNOSIS — E039 Hypothyroidism, unspecified: Secondary | ICD-10-CM | POA: Diagnosis not present

## 2018-04-17 DIAGNOSIS — E039 Hypothyroidism, unspecified: Secondary | ICD-10-CM | POA: Diagnosis not present

## 2018-06-19 DIAGNOSIS — Z13220 Encounter for screening for lipoid disorders: Secondary | ICD-10-CM | POA: Diagnosis not present

## 2018-06-19 DIAGNOSIS — M8588 Other specified disorders of bone density and structure, other site: Secondary | ICD-10-CM | POA: Diagnosis not present

## 2018-06-19 DIAGNOSIS — K589 Irritable bowel syndrome without diarrhea: Secondary | ICD-10-CM | POA: Diagnosis not present

## 2018-06-19 DIAGNOSIS — Z0000 Encounter for general adult medical examination without abnormal findings: Secondary | ICD-10-CM | POA: Diagnosis not present

## 2018-06-19 DIAGNOSIS — Z1211 Encounter for screening for malignant neoplasm of colon: Secondary | ICD-10-CM | POA: Diagnosis not present

## 2018-07-24 DIAGNOSIS — R634 Abnormal weight loss: Secondary | ICD-10-CM | POA: Diagnosis not present

## 2018-07-24 DIAGNOSIS — E039 Hypothyroidism, unspecified: Secondary | ICD-10-CM | POA: Diagnosis not present

## 2018-10-02 DIAGNOSIS — M9903 Segmental and somatic dysfunction of lumbar region: Secondary | ICD-10-CM | POA: Diagnosis not present

## 2018-10-02 DIAGNOSIS — M5136 Other intervertebral disc degeneration, lumbar region: Secondary | ICD-10-CM | POA: Diagnosis not present

## 2018-10-02 DIAGNOSIS — M5386 Other specified dorsopathies, lumbar region: Secondary | ICD-10-CM | POA: Diagnosis not present

## 2018-10-02 DIAGNOSIS — M9905 Segmental and somatic dysfunction of pelvic region: Secondary | ICD-10-CM | POA: Diagnosis not present

## 2018-10-09 DIAGNOSIS — M5386 Other specified dorsopathies, lumbar region: Secondary | ICD-10-CM | POA: Diagnosis not present

## 2018-10-09 DIAGNOSIS — M9903 Segmental and somatic dysfunction of lumbar region: Secondary | ICD-10-CM | POA: Diagnosis not present

## 2018-10-09 DIAGNOSIS — M5136 Other intervertebral disc degeneration, lumbar region: Secondary | ICD-10-CM | POA: Diagnosis not present

## 2018-10-09 DIAGNOSIS — M9905 Segmental and somatic dysfunction of pelvic region: Secondary | ICD-10-CM | POA: Diagnosis not present

## 2018-10-16 DIAGNOSIS — M9905 Segmental and somatic dysfunction of pelvic region: Secondary | ICD-10-CM | POA: Diagnosis not present

## 2018-10-16 DIAGNOSIS — M5386 Other specified dorsopathies, lumbar region: Secondary | ICD-10-CM | POA: Diagnosis not present

## 2018-10-16 DIAGNOSIS — M5136 Other intervertebral disc degeneration, lumbar region: Secondary | ICD-10-CM | POA: Diagnosis not present

## 2018-10-16 DIAGNOSIS — M9903 Segmental and somatic dysfunction of lumbar region: Secondary | ICD-10-CM | POA: Diagnosis not present

## 2018-10-23 DIAGNOSIS — M9903 Segmental and somatic dysfunction of lumbar region: Secondary | ICD-10-CM | POA: Diagnosis not present

## 2018-10-23 DIAGNOSIS — M9905 Segmental and somatic dysfunction of pelvic region: Secondary | ICD-10-CM | POA: Diagnosis not present

## 2018-10-23 DIAGNOSIS — M5136 Other intervertebral disc degeneration, lumbar region: Secondary | ICD-10-CM | POA: Diagnosis not present

## 2018-10-23 DIAGNOSIS — M5386 Other specified dorsopathies, lumbar region: Secondary | ICD-10-CM | POA: Diagnosis not present

## 2018-10-30 DIAGNOSIS — M9905 Segmental and somatic dysfunction of pelvic region: Secondary | ICD-10-CM | POA: Diagnosis not present

## 2018-10-30 DIAGNOSIS — M5386 Other specified dorsopathies, lumbar region: Secondary | ICD-10-CM | POA: Diagnosis not present

## 2018-10-30 DIAGNOSIS — M9903 Segmental and somatic dysfunction of lumbar region: Secondary | ICD-10-CM | POA: Diagnosis not present

## 2018-10-30 DIAGNOSIS — M5136 Other intervertebral disc degeneration, lumbar region: Secondary | ICD-10-CM | POA: Diagnosis not present

## 2018-11-06 DIAGNOSIS — M5136 Other intervertebral disc degeneration, lumbar region: Secondary | ICD-10-CM | POA: Diagnosis not present

## 2018-11-06 DIAGNOSIS — H524 Presbyopia: Secondary | ICD-10-CM | POA: Diagnosis not present

## 2018-11-06 DIAGNOSIS — M9905 Segmental and somatic dysfunction of pelvic region: Secondary | ICD-10-CM | POA: Diagnosis not present

## 2018-11-06 DIAGNOSIS — M5386 Other specified dorsopathies, lumbar region: Secondary | ICD-10-CM | POA: Diagnosis not present

## 2018-11-06 DIAGNOSIS — M9903 Segmental and somatic dysfunction of lumbar region: Secondary | ICD-10-CM | POA: Diagnosis not present

## 2018-11-13 DIAGNOSIS — G2581 Restless legs syndrome: Secondary | ICD-10-CM | POA: Diagnosis not present

## 2018-11-13 DIAGNOSIS — I87393 Chronic venous hypertension (idiopathic) with other complications of bilateral lower extremity: Secondary | ICD-10-CM | POA: Diagnosis not present

## 2018-11-20 DIAGNOSIS — M9905 Segmental and somatic dysfunction of pelvic region: Secondary | ICD-10-CM | POA: Diagnosis not present

## 2018-11-20 DIAGNOSIS — M5386 Other specified dorsopathies, lumbar region: Secondary | ICD-10-CM | POA: Diagnosis not present

## 2018-11-20 DIAGNOSIS — M5136 Other intervertebral disc degeneration, lumbar region: Secondary | ICD-10-CM | POA: Diagnosis not present

## 2018-11-20 DIAGNOSIS — M9903 Segmental and somatic dysfunction of lumbar region: Secondary | ICD-10-CM | POA: Diagnosis not present

## 2018-11-27 DIAGNOSIS — M9905 Segmental and somatic dysfunction of pelvic region: Secondary | ICD-10-CM | POA: Diagnosis not present

## 2018-11-27 DIAGNOSIS — M5386 Other specified dorsopathies, lumbar region: Secondary | ICD-10-CM | POA: Diagnosis not present

## 2018-11-27 DIAGNOSIS — M9903 Segmental and somatic dysfunction of lumbar region: Secondary | ICD-10-CM | POA: Diagnosis not present

## 2018-11-27 DIAGNOSIS — M5136 Other intervertebral disc degeneration, lumbar region: Secondary | ICD-10-CM | POA: Diagnosis not present

## 2019-01-24 DIAGNOSIS — Z681 Body mass index (BMI) 19 or less, adult: Secondary | ICD-10-CM | POA: Diagnosis not present

## 2019-01-24 DIAGNOSIS — Z01419 Encounter for gynecological examination (general) (routine) without abnormal findings: Secondary | ICD-10-CM | POA: Diagnosis not present

## 2019-01-24 DIAGNOSIS — Z1231 Encounter for screening mammogram for malignant neoplasm of breast: Secondary | ICD-10-CM | POA: Diagnosis not present

## 2019-03-14 DIAGNOSIS — M9905 Segmental and somatic dysfunction of pelvic region: Secondary | ICD-10-CM | POA: Diagnosis not present

## 2019-03-14 DIAGNOSIS — M5137 Other intervertebral disc degeneration, lumbosacral region: Secondary | ICD-10-CM | POA: Diagnosis not present

## 2019-03-14 DIAGNOSIS — M5417 Radiculopathy, lumbosacral region: Secondary | ICD-10-CM | POA: Diagnosis not present

## 2019-03-14 DIAGNOSIS — M9903 Segmental and somatic dysfunction of lumbar region: Secondary | ICD-10-CM | POA: Diagnosis not present

## 2019-03-16 DIAGNOSIS — M5137 Other intervertebral disc degeneration, lumbosacral region: Secondary | ICD-10-CM | POA: Diagnosis not present

## 2019-03-16 DIAGNOSIS — M9905 Segmental and somatic dysfunction of pelvic region: Secondary | ICD-10-CM | POA: Diagnosis not present

## 2019-03-16 DIAGNOSIS — M5417 Radiculopathy, lumbosacral region: Secondary | ICD-10-CM | POA: Diagnosis not present

## 2019-03-16 DIAGNOSIS — M9903 Segmental and somatic dysfunction of lumbar region: Secondary | ICD-10-CM | POA: Diagnosis not present

## 2019-04-16 DIAGNOSIS — H0262 Xanthelasma of right lower eyelid: Secondary | ICD-10-CM | POA: Diagnosis not present

## 2019-04-16 DIAGNOSIS — Z85828 Personal history of other malignant neoplasm of skin: Secondary | ICD-10-CM | POA: Diagnosis not present

## 2019-04-16 DIAGNOSIS — H0265 Xanthelasma of left lower eyelid: Secondary | ICD-10-CM | POA: Diagnosis not present

## 2019-04-16 DIAGNOSIS — L821 Other seborrheic keratosis: Secondary | ICD-10-CM | POA: Diagnosis not present

## 2019-04-25 DIAGNOSIS — E039 Hypothyroidism, unspecified: Secondary | ICD-10-CM | POA: Diagnosis not present

## 2020-05-05 DIAGNOSIS — Z85828 Personal history of other malignant neoplasm of skin: Secondary | ICD-10-CM | POA: Diagnosis not present

## 2020-05-05 DIAGNOSIS — D2262 Melanocytic nevi of left upper limb, including shoulder: Secondary | ICD-10-CM | POA: Diagnosis not present

## 2020-05-05 DIAGNOSIS — D225 Melanocytic nevi of trunk: Secondary | ICD-10-CM | POA: Diagnosis not present

## 2020-05-05 DIAGNOSIS — D2261 Melanocytic nevi of right upper limb, including shoulder: Secondary | ICD-10-CM | POA: Diagnosis not present

## 2020-05-26 DIAGNOSIS — E039 Hypothyroidism, unspecified: Secondary | ICD-10-CM | POA: Diagnosis not present

## 2020-06-02 DIAGNOSIS — E039 Hypothyroidism, unspecified: Secondary | ICD-10-CM | POA: Diagnosis not present

## 2020-10-20 DIAGNOSIS — H00025 Hordeolum internum left lower eyelid: Secondary | ICD-10-CM | POA: Diagnosis not present

## 2020-10-20 DIAGNOSIS — Z03818 Encounter for observation for suspected exposure to other biological agents ruled out: Secondary | ICD-10-CM | POA: Diagnosis not present

## 2020-10-20 DIAGNOSIS — Z20822 Contact with and (suspected) exposure to covid-19: Secondary | ICD-10-CM | POA: Diagnosis not present

## 2020-11-21 DIAGNOSIS — H01002 Unspecified blepharitis right lower eyelid: Secondary | ICD-10-CM | POA: Diagnosis not present

## 2021-01-12 DIAGNOSIS — H02883 Meibomian gland dysfunction of right eye, unspecified eyelid: Secondary | ICD-10-CM | POA: Diagnosis not present

## 2021-02-26 DIAGNOSIS — U071 COVID-19: Secondary | ICD-10-CM | POA: Diagnosis not present

## 2021-04-06 DIAGNOSIS — Z01419 Encounter for gynecological examination (general) (routine) without abnormal findings: Secondary | ICD-10-CM | POA: Diagnosis not present

## 2021-04-06 DIAGNOSIS — Z1231 Encounter for screening mammogram for malignant neoplasm of breast: Secondary | ICD-10-CM | POA: Diagnosis not present

## 2021-04-06 DIAGNOSIS — Z681 Body mass index (BMI) 19 or less, adult: Secondary | ICD-10-CM | POA: Diagnosis not present

## 2021-04-10 ENCOUNTER — Ambulatory Visit
Admission: RE | Admit: 2021-04-10 | Discharge: 2021-04-10 | Disposition: A | Discharge status: Home / Self Care | Payer: BC Managed Care – PPO | Source: Ambulatory Visit | Attending: Family Medicine | Admitting: Family Medicine

## 2021-04-10 ENCOUNTER — Other Ambulatory Visit: Payer: Self-pay | Admitting: Family Medicine

## 2021-04-10 DIAGNOSIS — R1084 Generalized abdominal pain: Secondary | ICD-10-CM

## 2021-04-10 DIAGNOSIS — R109 Unspecified abdominal pain: Secondary | ICD-10-CM | POA: Diagnosis not present

## 2021-04-10 DIAGNOSIS — R1030 Lower abdominal pain, unspecified: Secondary | ICD-10-CM | POA: Diagnosis not present

## 2021-05-05 ENCOUNTER — Other Ambulatory Visit: Payer: Self-pay | Admitting: Physician Assistant

## 2021-05-05 DIAGNOSIS — R1030 Lower abdominal pain, unspecified: Secondary | ICD-10-CM | POA: Diagnosis not present

## 2021-05-05 DIAGNOSIS — R140 Abdominal distension (gaseous): Secondary | ICD-10-CM | POA: Diagnosis not present

## 2021-05-05 DIAGNOSIS — R634 Abnormal weight loss: Secondary | ICD-10-CM | POA: Diagnosis not present

## 2021-05-18 ENCOUNTER — Other Ambulatory Visit: Payer: Self-pay

## 2021-05-18 ENCOUNTER — Ambulatory Visit
Admission: RE | Admit: 2021-05-18 | Discharge: 2021-05-18 | Disposition: A | Discharge status: Home / Self Care | Payer: BC Managed Care – PPO | Source: Ambulatory Visit | Attending: Physician Assistant | Admitting: Physician Assistant

## 2021-05-18 DIAGNOSIS — R1030 Lower abdominal pain, unspecified: Secondary | ICD-10-CM | POA: Diagnosis not present

## 2021-05-18 DIAGNOSIS — R634 Abnormal weight loss: Secondary | ICD-10-CM

## 2021-05-18 DIAGNOSIS — I700 Atherosclerosis of aorta: Secondary | ICD-10-CM | POA: Diagnosis not present

## 2021-05-18 MED ORDER — IOPAMIDOL (ISOVUE-300) INJECTION 61%
100.0000 mL | Freq: Once | INTRAVENOUS | Status: AC | PRN
  Administered 2021-05-18: 100 mL via INTRAVENOUS

## 2021-05-26 DIAGNOSIS — E039 Hypothyroidism, unspecified: Secondary | ICD-10-CM | POA: Diagnosis not present

## 2021-06-01 DIAGNOSIS — L570 Actinic keratosis: Secondary | ICD-10-CM | POA: Diagnosis not present

## 2021-06-01 DIAGNOSIS — R1030 Lower abdominal pain, unspecified: Secondary | ICD-10-CM | POA: Diagnosis not present

## 2021-06-01 DIAGNOSIS — L720 Epidermal cyst: Secondary | ICD-10-CM | POA: Diagnosis not present

## 2021-06-01 DIAGNOSIS — Z85828 Personal history of other malignant neoplasm of skin: Secondary | ICD-10-CM | POA: Diagnosis not present

## 2021-06-01 DIAGNOSIS — D225 Melanocytic nevi of trunk: Secondary | ICD-10-CM | POA: Diagnosis not present

## 2021-06-01 DIAGNOSIS — R140 Abdominal distension (gaseous): Secondary | ICD-10-CM | POA: Diagnosis not present

## 2021-06-01 DIAGNOSIS — L821 Other seborrheic keratosis: Secondary | ICD-10-CM | POA: Diagnosis not present

## 2021-06-02 DIAGNOSIS — E039 Hypothyroidism, unspecified: Secondary | ICD-10-CM | POA: Diagnosis not present

## 2021-10-15 DIAGNOSIS — R0789 Other chest pain: Secondary | ICD-10-CM | POA: Diagnosis not present

## 2021-10-26 DIAGNOSIS — J101 Influenza due to other identified influenza virus with other respiratory manifestations: Secondary | ICD-10-CM | POA: Diagnosis not present

## 2021-10-26 DIAGNOSIS — B349 Viral infection, unspecified: Secondary | ICD-10-CM | POA: Diagnosis not present

## 2021-10-26 DIAGNOSIS — Z03818 Encounter for observation for suspected exposure to other biological agents ruled out: Secondary | ICD-10-CM | POA: Diagnosis not present

## 2021-12-17 DIAGNOSIS — R300 Dysuria: Secondary | ICD-10-CM | POA: Diagnosis not present

## 2022-04-12 DIAGNOSIS — Z01419 Encounter for gynecological examination (general) (routine) without abnormal findings: Secondary | ICD-10-CM | POA: Diagnosis not present

## 2022-04-12 DIAGNOSIS — Z13820 Encounter for screening for osteoporosis: Secondary | ICD-10-CM | POA: Diagnosis not present

## 2022-04-12 DIAGNOSIS — Z1231 Encounter for screening mammogram for malignant neoplasm of breast: Secondary | ICD-10-CM | POA: Diagnosis not present

## 2022-04-12 DIAGNOSIS — Z681 Body mass index (BMI) 19 or less, adult: Secondary | ICD-10-CM | POA: Diagnosis not present

## 2022-04-24 DIAGNOSIS — H6993 Unspecified Eustachian tube disorder, bilateral: Secondary | ICD-10-CM | POA: Diagnosis not present

## 2022-04-26 DIAGNOSIS — H00014 Hordeolum externum left upper eyelid: Secondary | ICD-10-CM | POA: Diagnosis not present

## 2022-04-30 DIAGNOSIS — H00014 Hordeolum externum left upper eyelid: Secondary | ICD-10-CM | POA: Diagnosis not present

## 2022-05-31 DIAGNOSIS — E039 Hypothyroidism, unspecified: Secondary | ICD-10-CM | POA: Diagnosis not present

## 2022-06-07 DIAGNOSIS — E039 Hypothyroidism, unspecified: Secondary | ICD-10-CM | POA: Diagnosis not present

## 2022-07-14 DIAGNOSIS — D2239 Melanocytic nevi of other parts of face: Secondary | ICD-10-CM | POA: Diagnosis not present

## 2022-07-14 DIAGNOSIS — D2262 Melanocytic nevi of left upper limb, including shoulder: Secondary | ICD-10-CM | POA: Diagnosis not present

## 2022-07-14 DIAGNOSIS — D2272 Melanocytic nevi of left lower limb, including hip: Secondary | ICD-10-CM | POA: Diagnosis not present

## 2022-07-14 DIAGNOSIS — Z85828 Personal history of other malignant neoplasm of skin: Secondary | ICD-10-CM | POA: Diagnosis not present

## 2022-12-20 DIAGNOSIS — H9190 Unspecified hearing loss, unspecified ear: Secondary | ICD-10-CM | POA: Diagnosis not present

## 2023-01-05 ENCOUNTER — Emergency Department (HOSPITAL_BASED_OUTPATIENT_CLINIC_OR_DEPARTMENT_OTHER): Payer: BC Managed Care – PPO | Admitting: Radiology

## 2023-01-05 ENCOUNTER — Other Ambulatory Visit: Payer: Self-pay

## 2023-01-05 ENCOUNTER — Emergency Department (HOSPITAL_BASED_OUTPATIENT_CLINIC_OR_DEPARTMENT_OTHER)
Admission: EM | Admit: 2023-01-05 | Discharge: 2023-01-05 | Disposition: A | Discharge status: Home / Self Care | Payer: BC Managed Care – PPO | Attending: Emergency Medicine | Admitting: Emergency Medicine

## 2023-01-05 DIAGNOSIS — R0789 Other chest pain: Secondary | ICD-10-CM | POA: Diagnosis not present

## 2023-01-05 DIAGNOSIS — R002 Palpitations: Secondary | ICD-10-CM | POA: Insufficient documentation

## 2023-01-05 DIAGNOSIS — R079 Chest pain, unspecified: Secondary | ICD-10-CM

## 2023-01-05 LAB — CBC
HCT: 35.2 % — ABNORMAL LOW (ref 36.0–46.0)
Hemoglobin: 12.1 g/dL (ref 12.0–15.0)
MCH: 30.8 pg (ref 26.0–34.0)
MCHC: 34.4 g/dL (ref 30.0–36.0)
MCV: 89.6 fL (ref 80.0–100.0)
Platelets: 178 10*3/uL (ref 150–400)
RBC: 3.93 MIL/uL (ref 3.87–5.11)
RDW: 11.9 % (ref 11.5–15.5)
WBC: 3.9 10*3/uL — ABNORMAL LOW (ref 4.0–10.5)
nRBC: 0 % (ref 0.0–0.2)

## 2023-01-05 LAB — BASIC METABOLIC PANEL
Anion gap: 9 (ref 5–15)
BUN: 18 mg/dL (ref 8–23)
CO2: 28 mmol/L (ref 22–32)
Calcium: 9.8 mg/dL (ref 8.9–10.3)
Chloride: 104 mmol/L (ref 98–111)
Creatinine, Ser: 0.78 mg/dL (ref 0.44–1.00)
GFR, Estimated: >60 mL/min (ref >60)
Glucose, Bld: 89 mg/dL (ref 70–99)
Potassium: 3.9 mmol/L (ref 3.5–5.1)
Sodium: 141 mmol/L (ref 135–145)

## 2023-01-05 LAB — D-DIMER, QUANTITATIVE: D-Dimer, Quant: <0.27 ug/mL-FEU (ref 0.00–0.50)

## 2023-01-05 LAB — TSH: TSH: 2.371 u[IU]/mL (ref 0.350–4.500)

## 2023-01-05 LAB — TROPONIN I (HIGH SENSITIVITY): Troponin I (High Sensitivity): 2 ng/L (ref <18)

## 2023-01-05 NOTE — ED Triage Notes (Signed)
Pt via pov from home with palpitations since yesterday. Pt reports she had some chest pain over the weekend and thought it was related to lifting weights. She indicates that stopped and she began having palpitations and her bp is elevated. She also reports that she feels like she is unable to take a deep breath. Pt alert & oriented, nad noted.

## 2023-01-05 NOTE — ED Provider Notes (Signed)
[facility] Provider Note   CSN: [id number] Arrival date & time: 01/05/23  [id number]     History  Chief Complaint  Patient presents with   Palpitations    Stacey Green is a 62 y.o. female.   Palpitations Patient presents with palpitations and chest tightness.  States since yesterday.  States she was working out over the weekend and thought it may be related to that.  States had been doing better but then returned yesterday.  States she feels as if her chest is tight.  States that hurts to take a deep breath.  States she feels short of breath because of it.  No swelling in her legs.  Former smoker but vapes now.  No swelling in her legs.  No recent travel.  No fevers.    Past Medical History:  Diagnosis Date   AVM (arteriovenous malformation)    of cecum   Cold sore    pt woke up am of 11/22/11 went to MD on 11/22/11    Hemorrhoids    Hemorrhoids, external, with complication [id number]   Qualifier: Diagnosis of  By: Nelson-Smith CMA (AAMA), Dottie     IBS (irritable bowel syndrome)    Perianal condyloma latum    Thyroid disease    hypoactive    Home Medications Prior to Admission medications   Medication Sig Start Date End Date Taking? Authorizing Provider  Calcium Citrate (CITRACAL PO) Take 1 tablet by mouth daily.     [provider]  Calcium Polycarbophil (FIBER-LAX PO) Take 1 tablet by mouth daily.     [provider]  hydrocortisone (ANUSOL-HC) 25 MG suppository Place 1 suppository (25 mg total) rectally at bedtime. Patient not taking: Reported on 01/05/2023 04/22/15   Lafayette Dragon, MD  levothyroxine (SYNTHROID) 50 MCG tablet Take 50 mcg by mouth. 12/27/22   [provider]  Polyethylene Glycol 3350 (MIRALAX PO) Take 17 g by mouth daily. As directed    [provider]  valACYclovir (VALTREX) 1000 MG tablet Take 500 mg by mouth as needed. 2 tablets twice a day for a total of 4 tablets Start date  11/22/11    [provider]      Allergies    Oxycodone and Sulfonamide derivatives    Review of Systems   Review of Systems  Cardiovascular:  Positive for palpitations.    Physical Exam Updated Vital Signs BP 103/82   Pulse 62   Temp 97.8 F (36.6 C) (Oral)   Resp 18   Ht 5' 2.5" (1.588 m)   Wt 50.3 kg   SpO2 97%   BMI 19.98 kg/m  Physical Exam Vitals and nursing note reviewed.  Eyes:     Pupils: Pupils are equal, round, and reactive to light.  Cardiovascular:     Rate and Rhythm: Regular rhythm.  Pulmonary:     Comments: Mild and chest tenderness. Chest:     Chest wall: Tenderness present.  Abdominal:     Tenderness: There is no abdominal tenderness.  Musculoskeletal:        General: No tenderness.     Cervical back: Neck supple.  Skin:    General: Skin is warm.     Capillary Refill: Capillary refill takes less than 2 seconds.  Neurological:     Mental Status: She is alert and oriented to person, place, and time.     ED Results / Procedures / Treatments   Labs (all labs ordered are listed,  but only abnormal results are displayed) Labs Reviewed  CBC - Abnormal; Notable for the following components:      Result Value   WBC 3.9 (*)    HCT 35.2 (*)    All other components within normal limits  BASIC METABOLIC PANEL  D-DIMER, QUANTITATIVE  TSH  TROPONIN I (HIGH SENSITIVITY)    EKG EKG Interpretation  Date/Time:  Wednesday January 05 2023 09:11:17 EDT Ventricular Rate:  68 PR Interval:  129 QRS Duration: 90 QT Interval:  390 QTC Calculation: 415 R Axis:   34 Text Interpretation: Sinus rhythm Consider right atrial enlargement Confirmed by Davonna Belling [phone]) on 01/05/2023 9:14:17 AM  Radiology DG Chest 2 View  Result Date: 01/05/2023 CLINICAL DATA:  Palpitations. EXAM: CHEST - 2 VIEW COMPARISON:  None Available. FINDINGS: Cardiac silhouette and mediastinal contours are within normal limits. The lungs are clear. No pleural effusion or  pneumothorax. Mild multilevel degenerative disc changes of the thoracic spine. Mild levocurvature centered at L1. IMPRESSION: No active cardiopulmonary disease. Electronically Signed   By: Yvonne Kendall M.D.   On: 01/05/2023 09:43    Procedures Procedures    Medications Ordered in ED Medications - No data to display  ED Course/ Medical Decision Making/ A&P                             Medical Decision Making Amount and/or Complexity of Data Reviewed Labs: ordered. Radiology: ordered.   Patient with shortness of breath anterior chest pain.  Began day or 2 ago.  Pleuritic.  States she feels short of breath.  Not hypoxic or tachycardic but will check D-dimer to evaluate for pulm embolism.  Also EKG reassuring but will check troponin evaluate for cardiac cause.  Will also get chest x-ray.  Chest x-ray independently interpreted reassuring.  Blood work reassuring.  Troponin negative.  D-dimer negative.  TSH reassuring.  Appears stable for discharge home.  Doubt cardiac ischemia.  Doubt aortic dissection.  Follow-up with PCP as needed.        Final Clinical Impression(s) / ED Diagnoses Final diagnoses:  Palpitations  Nonspecific chest pain    Rx / DC Orders ED Discharge Orders     None         Davonna Belling, MD 01/05/23 1116

## 2023-01-10 DIAGNOSIS — R002 Palpitations: Secondary | ICD-10-CM | POA: Diagnosis not present

## 2023-01-12 NOTE — Progress Notes (Unsigned)
Cardiology Office Note:    Date:  01/13/2023   ID:  Stacey Green, DOB 1961/09/27, MRN [id number]  PCP:  [facility] Cardiologist:  Lenna Sciara, MD Referring MD: [facility]   Chief Complaint/Reason for Referral: Palpitations  ASSESSMENT:    1. Precordial pain   2. Palpitations   3. Aortic atherosclerosis ([place])   4. Hyperlipidemia LDL goal <70     PLAN:    In order of problems listed above: 1.  Chest pain: This only happened after the patient developed palpitations.  She exercise on a regular basis.  We will defer any evaluation for now. 2.  Palpitations: Will obtain monitor and echocardiogram to evaluate further. 3.  Aortic atherosclerosis: Start aspirin 81 mg and atorvastatin 20 mg. 4.  Hyperlipidemia: By virtue of presence of aortic atherosclerosis patient's LDL goal is less than 70.  Will check lipid panel LFTs in 2 months.             Dispo:  Return if symptoms worsen or fail to improve.      Medication Adjustments/Labs and Tests Ordered: Current medicines are reviewed at length with the patient today.  Concerns regarding medicines are outlined above.  The following changes have been made:     Labs/tests ordered: Orders Placed This Encounter  Procedures   Lipid panel   Lipoprotein A (LPA)   Hepatic function panel   LONG TERM MONITOR (3-14 DAYS)   ECHOCARDIOGRAM COMPLETE    Medication Changes: Meds ordered this encounter  Medications   atorvastatin (LIPITOR) 20 MG tablet    Sig: Take 1 tablet (20 mg total) by mouth daily.    Dispense:  90 tablet    Refill:  3   aspirin EC 81 MG tablet    Sig: Take 1 tablet (81 mg total) by mouth daily. Swallow whole.    Dispense:  30 tablet    Refill:  12     Current medicines are reviewed at length with the patient today.  The patient does not have concerns regarding medicines.   History of Present Illness:    FOCUSED PROBLEM LIST:    Aortic atherosclerosis on CT abdomen pelvis 2022    The patient is a 62 y.o. female with the indicated medical history here for emergency room follow-up for palpitations.  The patient was seen in the emergency department recently with complaints of palpitations and chest pain.  She thought it may have been related to a workout that she performed over the weekend.  She noticed that the pain was worse with inspiration.  Her chest wall was found to be tender on palpation.  She also complained of palpitations at times.  An evaluation including laboratories and chest x-ray were reassuring.  Her EKG demonstrated no acute ischemic changes.  She was discharged home.  The patient tells me that the day after her surgeries were She felt a lot of chest discomfort. feeling had resolved however she woke up with palpitations.  Then every day for the next few days she woke up with palpitations.  The palpitations did not occur at any other time.  She denies any presyncope or syncope.  She denies any peripheral edema or paroxysmal active dyspnea.  She has been exercising on a regular basis without any issues.  When she was seen by her primary care provider's office she was prescribed beta-blocker but has not yet taken this.  Interestingly her palpitations have not  occurred today.          Current Medications: Current Meds  Medication Sig   aspirin EC 81 MG tablet Take 1 tablet (81 mg total) by mouth daily. Swallow whole.   atorvastatin (LIPITOR) 20 MG tablet Take 1 tablet (20 mg total) by mouth daily.   levothyroxine (SYNTHROID) 50 MCG tablet Take 50 mcg by mouth.   Multiple Vitamin (MULTI VITAMIN) TABS Take 1 tablet by mouth daily.   Polyethylene Glycol 3350 (MIRALAX PO) Take 17 g by mouth daily. As directed   VITAMIN D PO Take 1,200 mcg by mouth daily.     Allergies:    Oxycodone and Sulfonamide derivatives   Social History:   Social History   Tobacco Use   Smoking status: Former    Packs/day: 0.25     Years: 25.00    Additional pack years: 0.00    Total pack years: 6.25    Types: Cigarettes   Smokeless tobacco: Never   Tobacco comments:    Counseling sheet given in exam room   Substance Use Topics   Alcohol use: Yes    Alcohol/week: 0.0 standard drinks of alcohol    Comment: occ   Drug use: No    Comment: hx of marijuana none current      Family Hx: Family History  Problem Relation Age of Onset   Alcohol abuse Brother    Irritable bowel syndrome Sister        x2   Irritable bowel syndrome Mother    Colon cancer Neg Hx    Esophageal cancer Neg Hx    Rectal cancer Neg Hx    Stomach cancer Neg Hx      Review of Systems:   Please see the history of present illness.    All other systems reviewed and are negative.     EKGs/Labs/Other Test Reviewed:    EKG:  EKG performed March 2024 that I personally reviewed demonstrates rhythm possible right atrial enlargement  Prior CV studies: None available     Other studies Reviewed: Review of the additional studies/records demonstrates: CT abdomen pelvis 2022 with aortic atherosclerosis  Recent Labs: 01/05/2023: BUN 18; Creatinine, Ser 0.78; Hemoglobin 12.1; Platelets 178; Potassium 3.9; Sodium 141; TSH 2.371   Recent Lipid Panel No results found for: "CHOL", "TRIG", "HDL", "LDLCALC", "LDLDIRECT"  Risk Assessment/Calculations:                Physical Exam:    VS:  BP 118/74   Pulse 79   Ht 5\' 2"  (1.575 m)   Wt 113 lb 6.4 oz (51.4 kg)   SpO2 98%   BMI 20.74 kg/m    Wt Readings from Last 3 Encounters:  01/13/23 113 lb 6.4 oz (51.4 kg)  01/05/23 111 lb (50.3 kg)  04/29/15 109 lb (49.4 kg)    GENERAL:  No apparent distress, AOx3 HEENT:  No carotid bruits, +2 carotid impulses, no scleral icterus CAR: RRR no murmurs, gallops, rubs, or thrills RES:  Clear to auscultation bilaterally ABD:  Soft, nontender, nondistended, positive bowel sounds x 4 VASC:  +2 radial pulses, +2 carotid pulses, palpable pedal  pulses NEURO:  CN 2-12 grossly intact; motor and sensory grossly intact PSYCH:  No active depression or anxiety EXT:  No edema, ecchymosis, or cyanosis  Signed, Early Osmond, MD  01/13/2023 2:35 PM    [address], [place], [place]  [address] Phone: [phone]; Fax: [phone]   Note:  This document was prepared using Dragon voice recognition software and may include unintentional dictation errors.

## 2023-01-13 ENCOUNTER — Encounter: Payer: Self-pay | Admitting: Internal Medicine

## 2023-01-13 ENCOUNTER — Ambulatory Visit: Payer: BC Managed Care – PPO | Attending: Internal Medicine | Admitting: Internal Medicine

## 2023-01-13 ENCOUNTER — Ambulatory Visit: Payer: BC Managed Care – PPO | Attending: Internal Medicine

## 2023-01-13 VITALS — BP 118/74 | HR 79 | Ht 62.0 in | Wt 113.4 lb

## 2023-01-13 DIAGNOSIS — R002 Palpitations: Secondary | ICD-10-CM

## 2023-01-13 DIAGNOSIS — R072 Precordial pain: Secondary | ICD-10-CM

## 2023-01-13 DIAGNOSIS — E785 Hyperlipidemia, unspecified: Secondary | ICD-10-CM

## 2023-01-13 DIAGNOSIS — I700 Atherosclerosis of aorta: Secondary | ICD-10-CM

## 2023-01-13 MED ORDER — ASPIRIN 81 MG PO TBEC: 81.0000 mg | DELAYED_RELEASE_TABLET | Freq: Every day | ORAL | 12 refills | Status: AC

## 2023-01-13 MED ORDER — ATORVASTATIN CALCIUM 20 MG PO TABS: 20.0000 mg | ORAL_TABLET | Freq: Every day | ORAL | 3 refills | Status: DC

## 2023-01-13 NOTE — Patient Instructions (Signed)
Medication Instructions:  Your physician has recommended you make the following change in your medication:   Start taking aspirin 81 mg daily Start taking atorvastatin 20 mg daily *If you need a refill on your cardiac medications before your next appointment, please call your pharmacy*   Lab Work: Lipids, LFT, LPa If you have labs (blood work) drawn today and your tests are completely normal, you will receive your results only by: [place] (if you have MyChart) OR A paper copy in the mail If you have any lab test that is abnormal or we need to change your treatment, we will call you to review the results.   Testing/Procedures: Your physician has requested that you have an echocardiogram. Echocardiography is a painless test that uses sound waves to create images of your heart. It provides your doctor with information about the size and shape of your heart and how well your heart's chambers and valves are working. This procedure takes approximately one hour. There are no restrictions for this procedure. Please do NOT wear cologne, perfume, aftershave, or lotions (deodorant is allowed). Please arrive 15 minutes prior to your appointment time.   Your physician has recommended that you wear an event monitor. Event monitors are medical devices that record the heart's electrical activity. Doctors most often [place] these monitors to diagnose arrhythmias. Arrhythmias are problems with the speed or rhythm of the heartbeat. The monitor is a small, portable device. You can wear one while you do your normal daily activities. This is usually used to diagnose what is causing palpitations/syncope (passing out).    Follow-Up: At [facility], you and your health needs are our priority.  As part of our continuing mission to provide you with exceptional heart care, we have created designated Provider Care Teams.  These Care Teams include your primary Cardiologist (physician) and Advanced Practice  Providers (APPs -  Physician Assistants and Nurse Practitioners) who all work together to provide you with the care you need, when you need it.   Your next appointment:   As needed   Provider:   Dr Ali Lowe   Other Instructions [place] Monitor Instructions  Your physician has requested you wear a ZIO patch monitor for 7 days.  This is a single patch monitor. [employer] supplies one patch monitor per enrollment. Additional stickers are not available. Please do not apply patch if you will be having a Nuclear Stress Test,  Echocardiogram, Cardiac CT, MRI, or Chest Xray during the period you would be wearing the  monitor. The patch cannot be worn during these tests. You cannot remove and re-apply the  ZIO XT patch monitor.  Your ZIO patch monitor will be mailed 3 day USPS to your address on file. It may take 3-5 days  to receive your monitor after you have been enrolled.  Once you have received your monitor, please review the enclosed instructions. Your monitor  has already been registered assigning a specific monitor serial # to you.  Billing and Patient Assistance Program Information  We have supplied [employer] with any of your insurance information on file for billing purposes. [employer] offers a sliding scale Patient Assistance Program for patients that do not have  insurance, or whose insurance does not completely cover the cost of the ZIO monitor.  You must apply for the Patient Assistance Program to qualify for this discounted rate.  To apply, please call [employer] at [phone], select option 4, select option 2, ask to apply for  Patient Assistance  Program. Theodore Demark will ask your household income, and how many people  are in your household. They will quote your out-of-pocket cost based on that information.  [employer] will also be able to set up a 64-month, interest-free payment plan if needed.  Applying the monitor   Shave hair from upper left chest.  Hold abrader disc by  orange tab. Rub abrader in 40 strokes over the upper left chest as  indicated in your monitor instructions.  Clean area with 4 enclosed alcohol pads. Let dry.  Apply patch as indicated in monitor instructions. Patch will be placed under collarbone on left  side of chest with arrow pointing upward.  Rub patch adhesive wings for 2 minutes. Remove white label marked "1". Remove the white  label marked "2". Rub patch adhesive wings for 2 additional minutes.  While looking in a mirror, press and release button in center of patch. A small green light will  flash 3-4 times. This will be your only indicator that the monitor has been turned on.  Do not shower for the first 24 hours. You may shower after the first 24 hours.  Press the button if you feel a symptom. You will hear a small click. Record Date, Time and  Symptom in the Patient Logbook.  When you are ready to remove the patch, follow instructions on the last 2 pages of Patient  Logbook. Stick patch monitor onto the last page of Patient Logbook.  Place Patient Logbook in the blue and white box. Use locking tab on box and tape box closed  securely. The blue and white box has prepaid postage on it. Please place it in the mailbox as  soon as possible. Your physician should have your test results approximately 7 days after the  monitor has been mailed back to [facility].  Call [place] at [phone] if you have questions regarding  your ZIO XT patch monitor. Call them immediately if you see an orange light blinking on your  monitor.  If your monitor falls off in less than 4 days, contact our Monitor department at [phone].  If your monitor becomes loose or falls off after 4 days call [employer] at [phone] for  suggestions on securing your monitor

## 2023-01-13 NOTE — Progress Notes (Unsigned)
Enrolled patient for a 7 day Zio XT monitor to be mailed to patients home.  

## 2023-01-18 DIAGNOSIS — I700 Atherosclerosis of aorta: Secondary | ICD-10-CM

## 2023-01-18 DIAGNOSIS — R072 Precordial pain: Secondary | ICD-10-CM

## 2023-01-18 DIAGNOSIS — R002 Palpitations: Secondary | ICD-10-CM

## 2023-01-18 DIAGNOSIS — E785 Hyperlipidemia, unspecified: Secondary | ICD-10-CM | POA: Diagnosis not present

## 2023-02-05 IMAGING — CR DG ABDOMEN 2V
2 series · 2 of 2 positions shown · non-contrast
Comparison: None.

CLINICAL DATA: Lower abdominal pain and distension

EXAM:
ABDOMEN - 2 VIEW

[w abdomen upright]
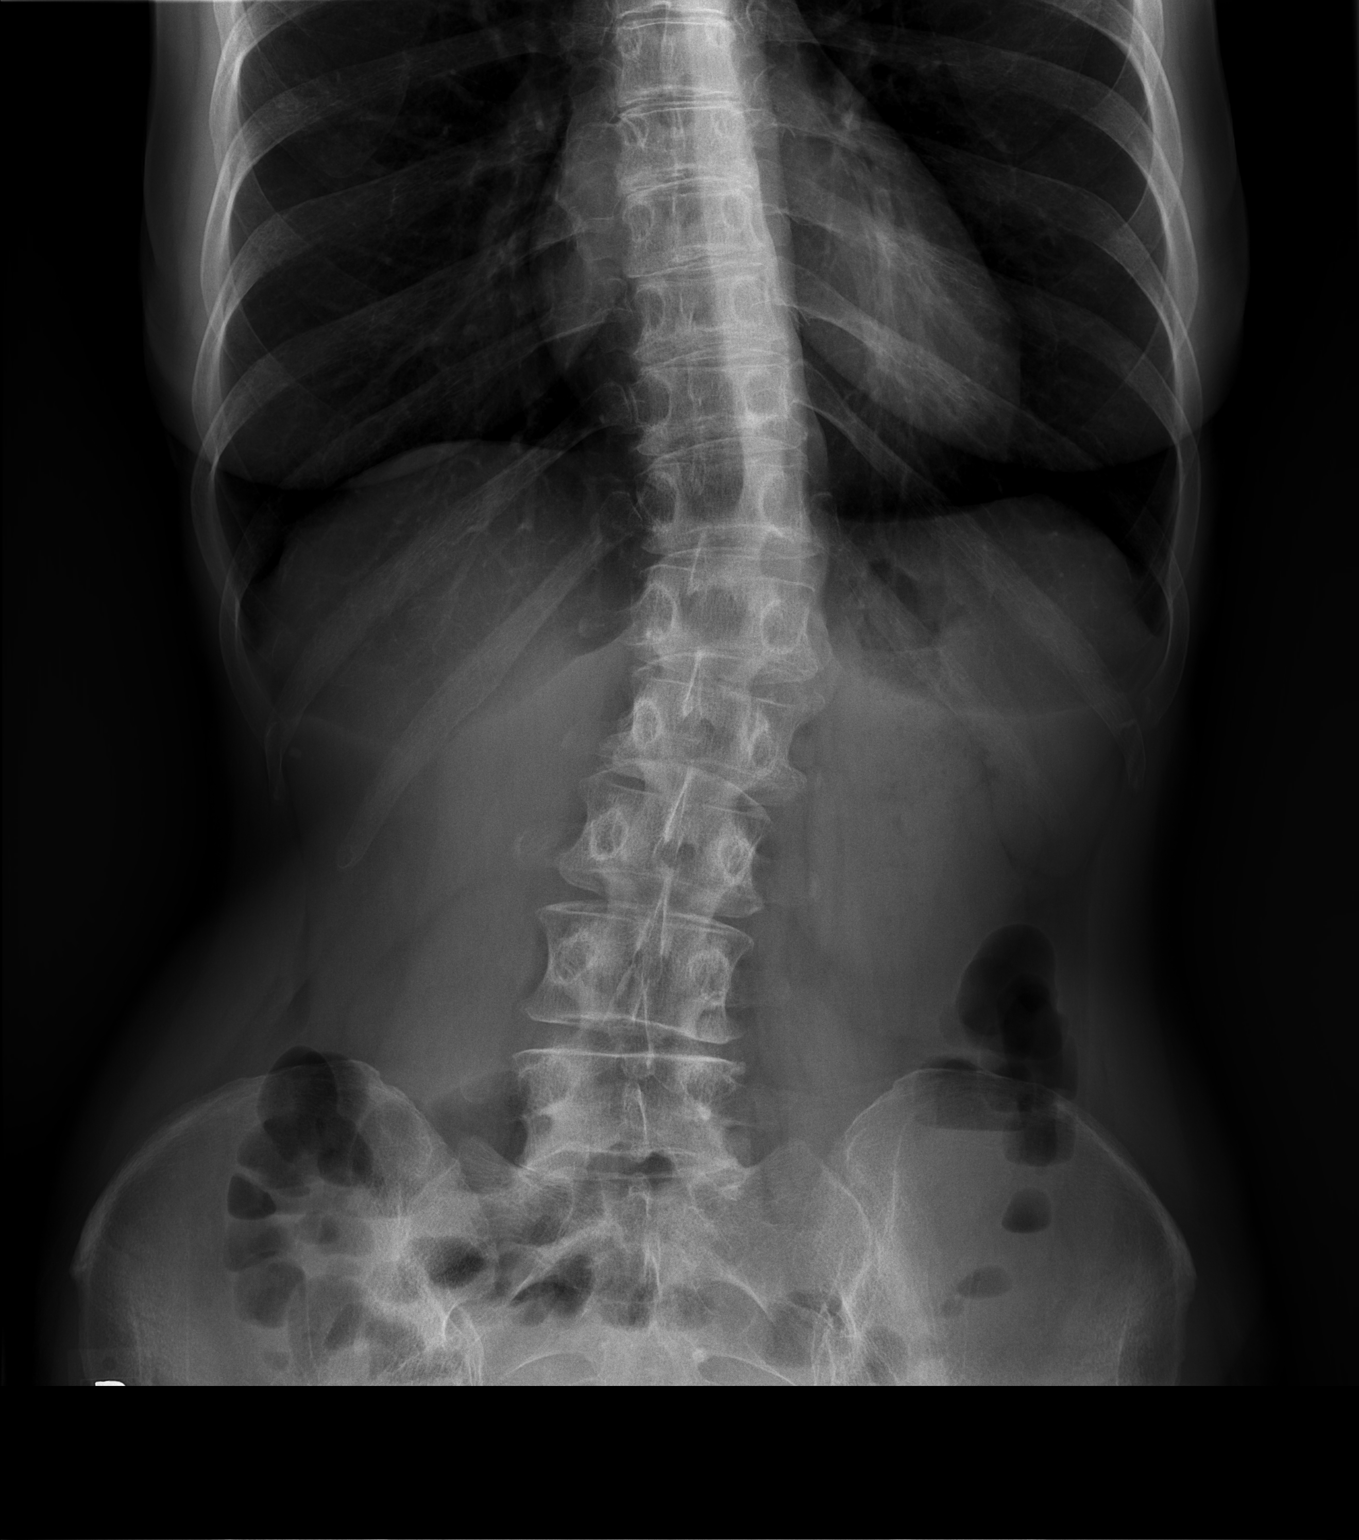

[t abdomen supine]
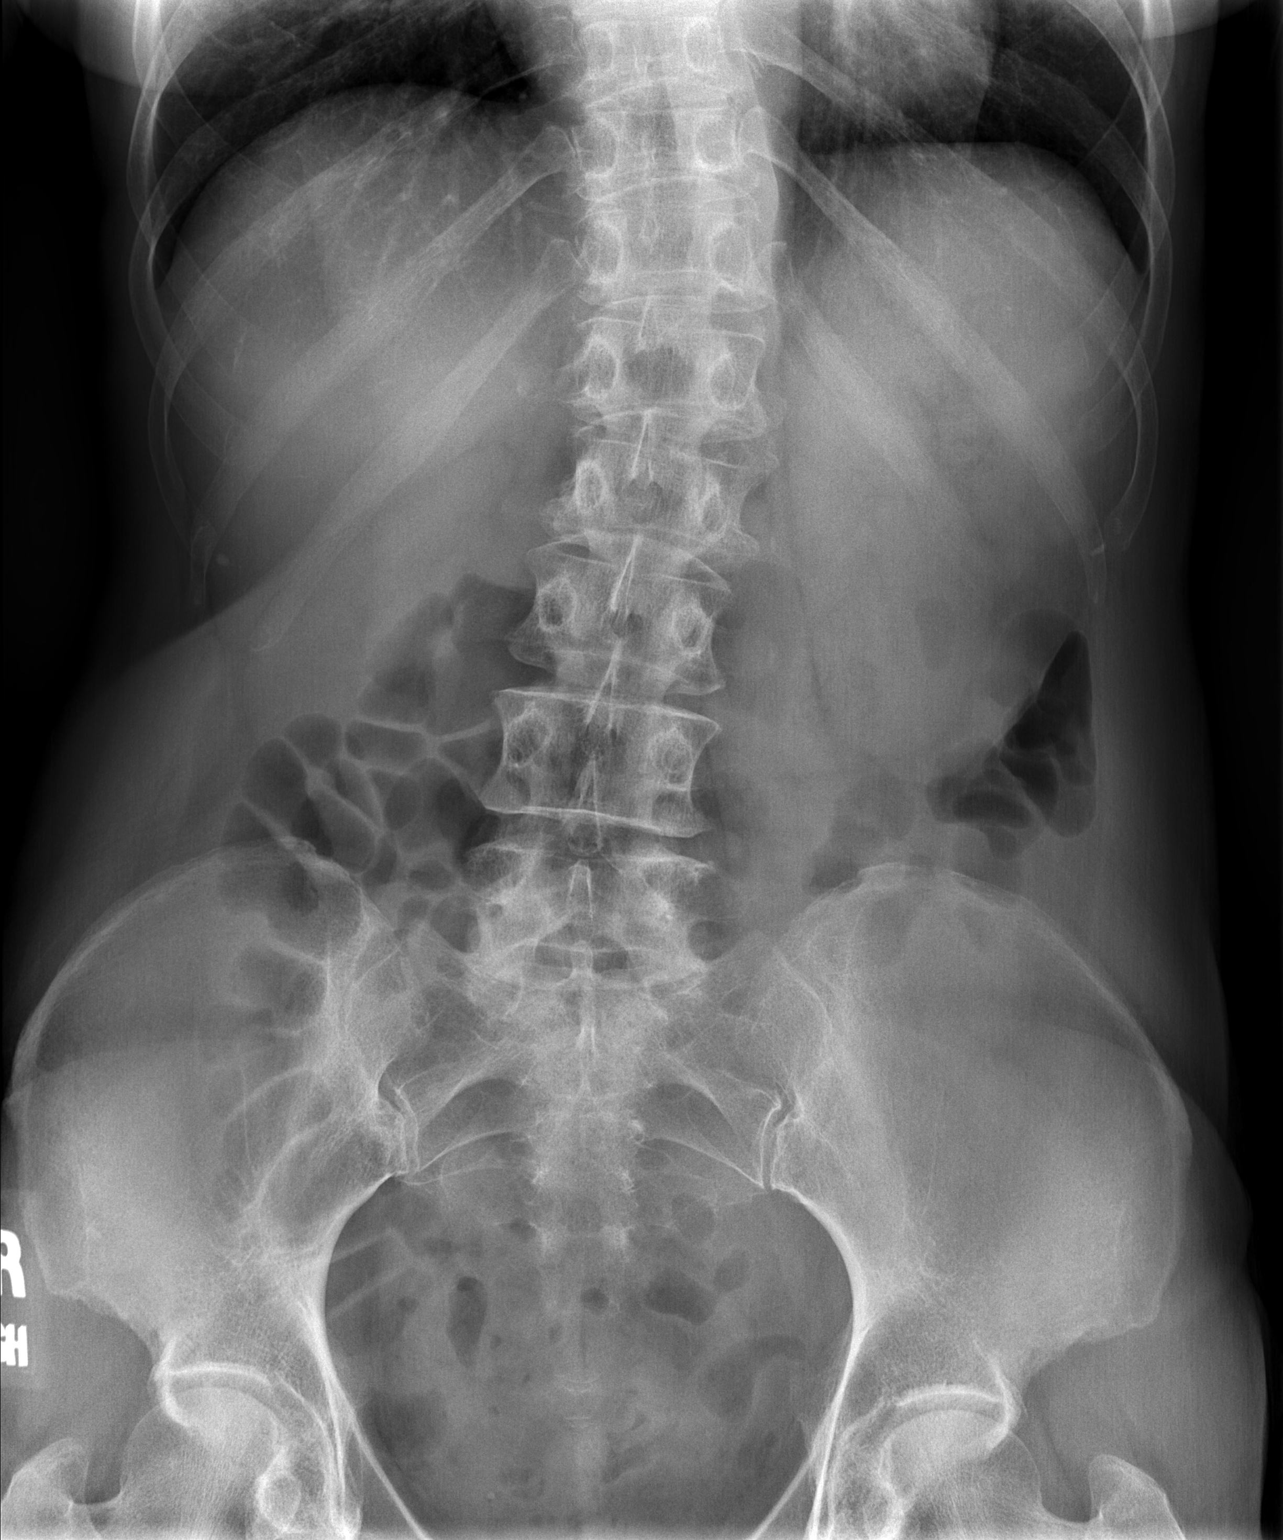

[2 of 2 positions shown; findings below may reference images not displayed]

FINDINGS: Supine and upright frontal views of the abdomen and pelvis
demonstrate an unremarkable bowel gas pattern. No obstruction or
ileus. No masses or abnormal calcifications. No free gas in the
greater peritoneal sac. Lung bases are clear. Left convex scoliosis
centered at the thoracolumbar junction.
IMPRESSION: 1. Unremarkable bowel gas pattern.

## 2023-02-11 ENCOUNTER — Ambulatory Visit (HOSPITAL_COMMUNITY): Payer: BC Managed Care – PPO

## 2023-02-11 ENCOUNTER — Encounter (HOSPITAL_COMMUNITY): Payer: Self-pay

## 2023-03-02 ENCOUNTER — Encounter (HOSPITAL_COMMUNITY): Payer: Self-pay | Admitting: Internal Medicine

## 2023-03-14 ENCOUNTER — Ambulatory Visit: Payer: BC Managed Care – PPO | Attending: Internal Medicine

## 2023-03-14 DIAGNOSIS — R002 Palpitations: Secondary | ICD-10-CM

## 2023-03-14 DIAGNOSIS — R072 Precordial pain: Secondary | ICD-10-CM | POA: Diagnosis not present

## 2023-03-14 DIAGNOSIS — I700 Atherosclerosis of aorta: Secondary | ICD-10-CM

## 2023-03-14 DIAGNOSIS — E785 Hyperlipidemia, unspecified: Secondary | ICD-10-CM | POA: Diagnosis not present

## 2023-03-14 DIAGNOSIS — H6981 Other specified disorders of Eustachian tube, right ear: Secondary | ICD-10-CM | POA: Diagnosis not present

## 2023-03-14 DIAGNOSIS — H903 Sensorineural hearing loss, bilateral: Secondary | ICD-10-CM | POA: Diagnosis not present

## 2023-03-15 IMAGING — CT CT ABD-PELV W/ CM
1 of 3 series · 14 of 32 positions shown, 19 images · IV contrast (APPLIED)
Comparison: None.

CLINICAL DATA: Lower abdominal pain, weight loss

EXAM:
CT ABDOMEN AND PELVIS WITH CONTRAST
TECHNIQUE: Multidetector CT imaging of the abdomen and pelvis was performed
using the standard protocol following bolus administration of
intravenous contrast.
CONTRAST:  100mL 11SHEG-5VV IOPAMIDOL (11SHEG-5VV) INJECTION 61%

[Series 2: abd/pelvis w/cm · axial · 0.68mm/px · z∈[-474,-134]mm · 14 of 78 slices shown, 19 images]
[im 5/78  soft-tissue]
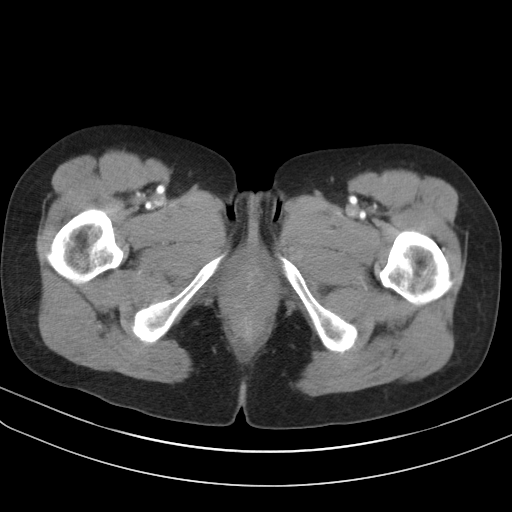
[im 5/78  bone]
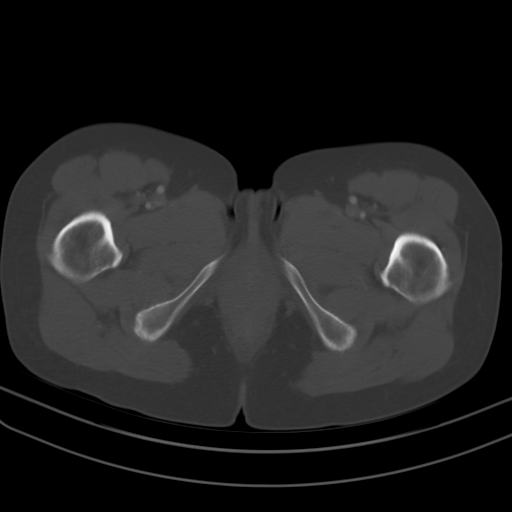
[im 10/78  soft-tissue]
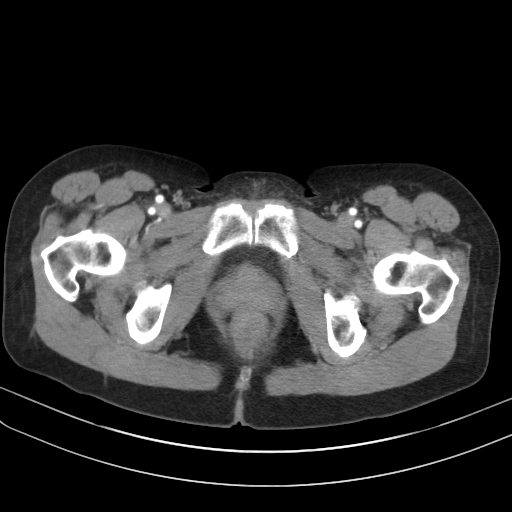
[im 19/78  soft-tissue]
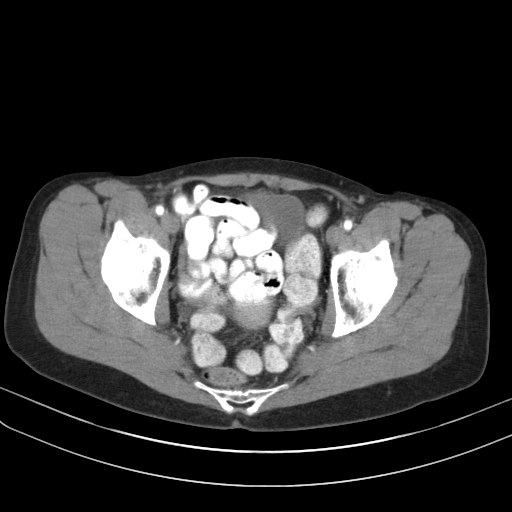
[im 23/78  soft-tissue]
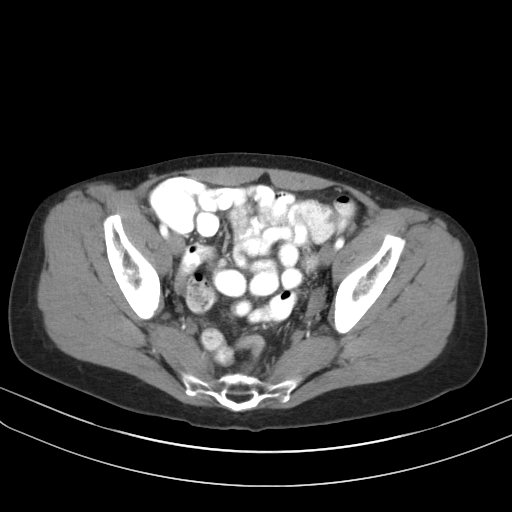
[im 28/78  soft-tissue]
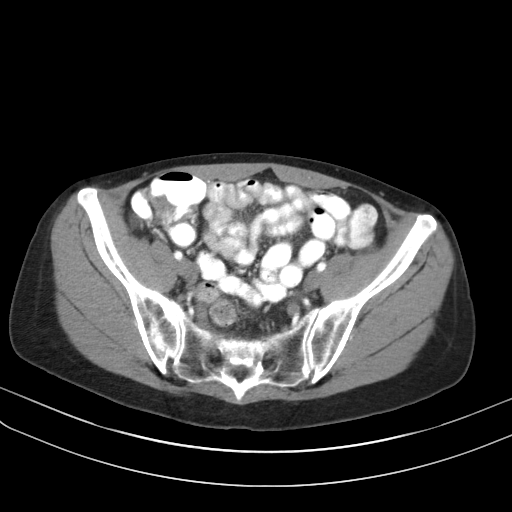
[im 32/78  soft-tissue]
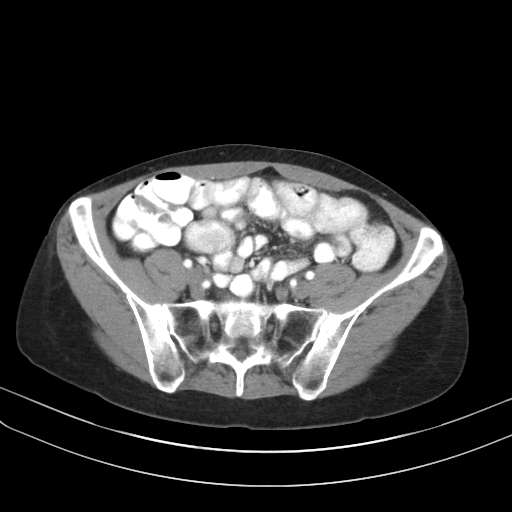
[im 41/78  soft-tissue]
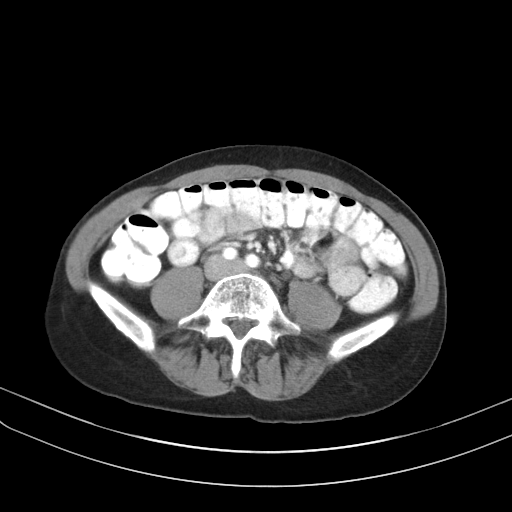
[im 46/78  soft-tissue]
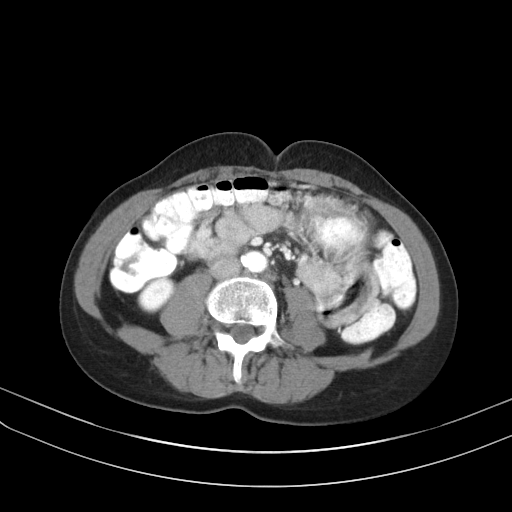
[im 50/78  soft-tissue]
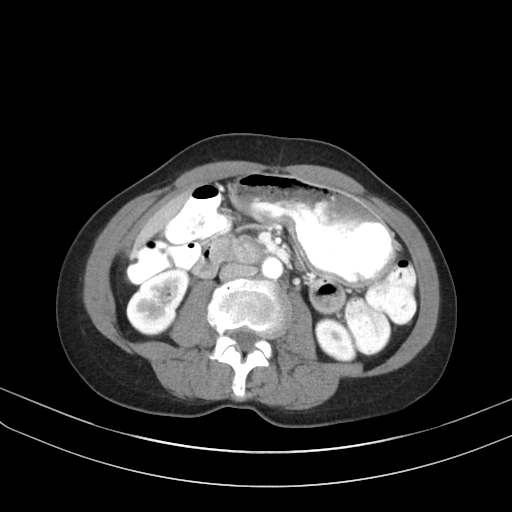
[im 50/78  bone]
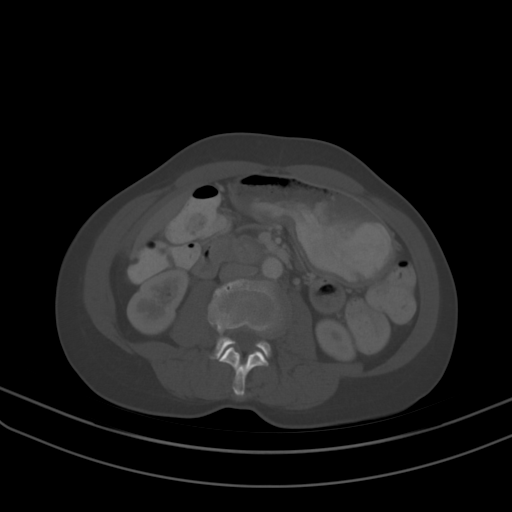
[im 55/78  soft-tissue]
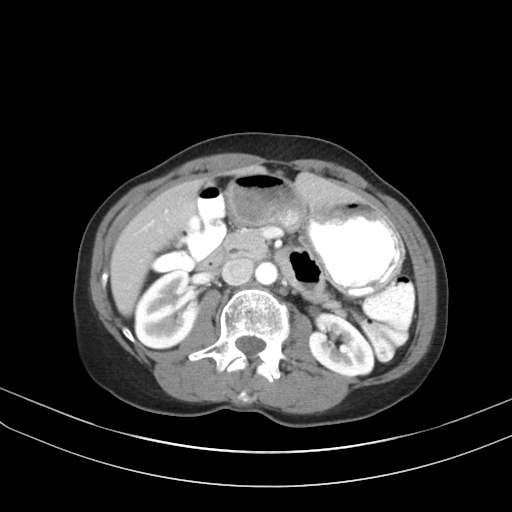
[im 59/78  soft-tissue]
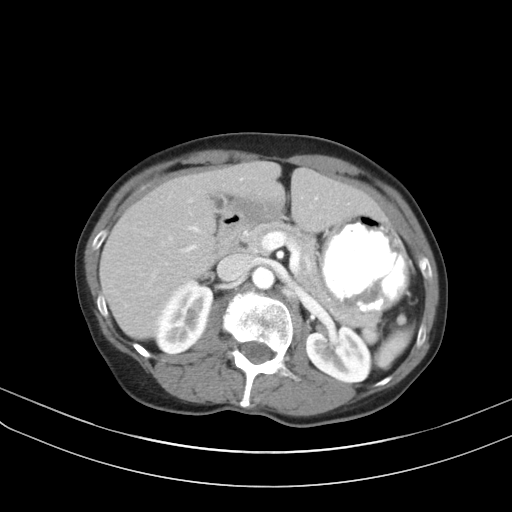
[im 59/78  lung]
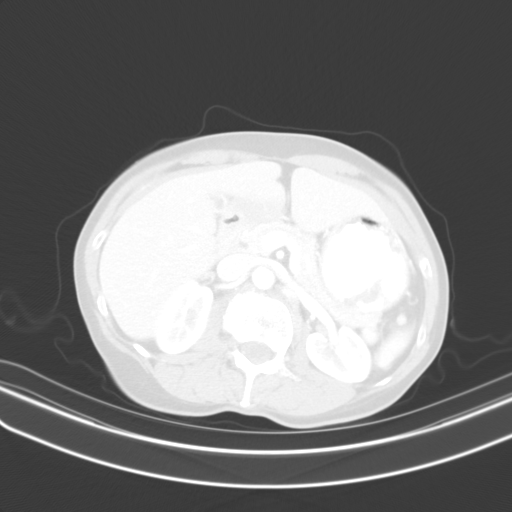
[im 64/78  lung]
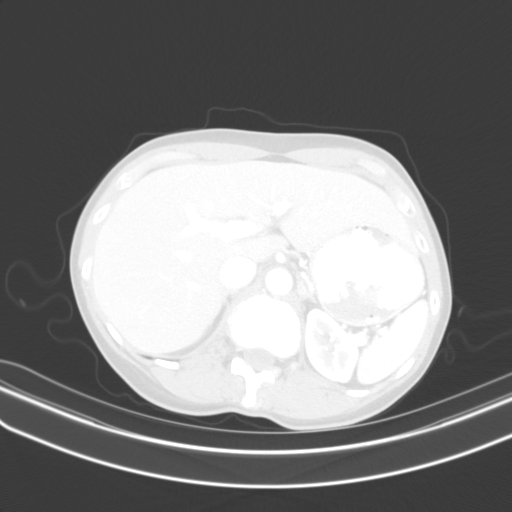
[im 68/78  soft-tissue]
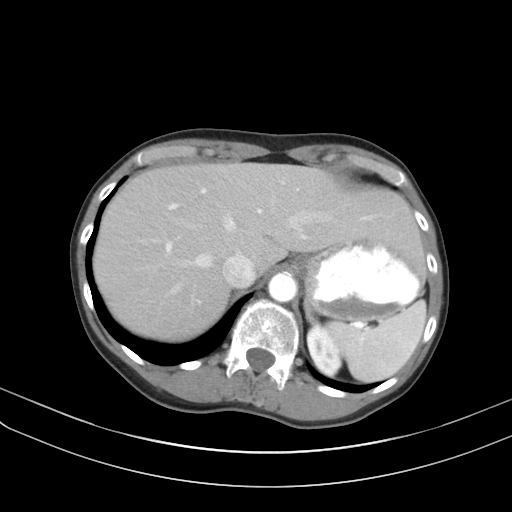
[im 68/78  lung]
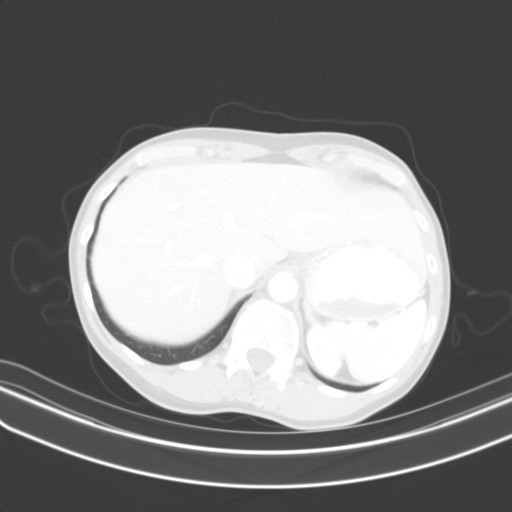
[im 73/78  soft-tissue]
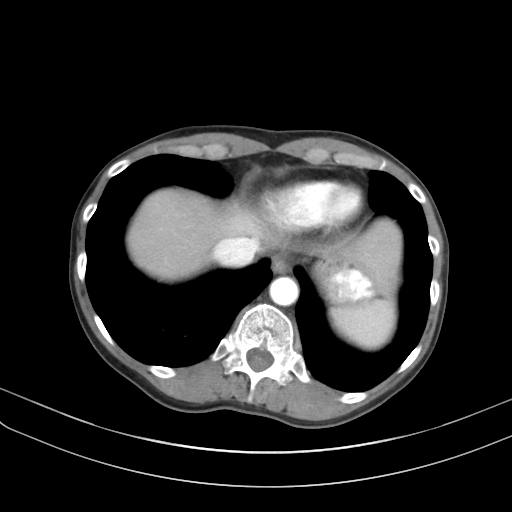
[im 73/78  lung]
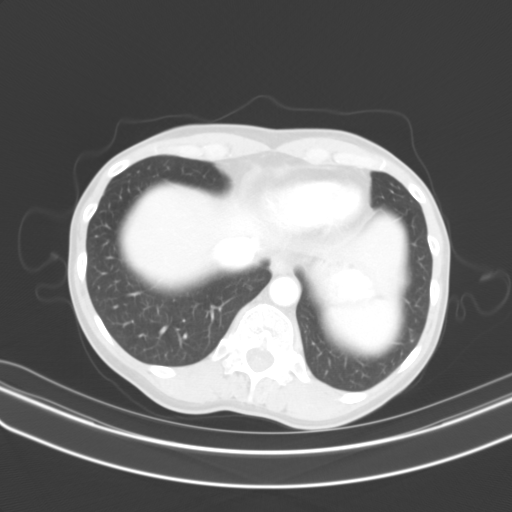

[14 of 32 positions shown; findings below may reference images not displayed]

FINDINGS: Lower chest: Lung bases are clear. No effusions. Heart is normal
size.

Hepatobiliary: No focal hepatic abnormality. Gallbladder
unremarkable.

Pancreas: No focal abnormality or ductal dilatation.

Spleen: No focal abnormality.  Normal size.

Adrenals/Urinary Tract: No adrenal abnormality. No focal renal
abnormality. No stones or hydronephrosis. Urinary bladder is
unremarkable.

Stomach/Bowel: Stomach, large and small bowel grossly unremarkable.

Vascular/Lymphatic: Scattered aortic atherosclerosis. No evidence of
aneurysm or adenopathy.

Reproductive: Uterus and adnexa unremarkable.  No mass.

Other: No free fluid or free air.

Musculoskeletal: No acute bony abnormality.
IMPRESSION: No acute findings in the abdomen or pelvis. No explanation for the
patient's abdominal pain or weight loss.

## 2023-03-16 ENCOUNTER — Telehealth (HOSPITAL_COMMUNITY): Payer: Self-pay | Admitting: Internal Medicine

## 2023-03-16 LAB — HEPATIC FUNCTION PANEL
ALT: 17 IU/L (ref 0–32)
AST: 24 IU/L (ref 0–40)
Albumin: 4.6 g/dL (ref 3.9–4.9)
Alkaline Phosphatase: 77 IU/L (ref 44–121)
Bilirubin Total: 0.5 mg/dL (ref 0.0–1.2)
Bilirubin, Direct: 0.12 mg/dL (ref 0.00–0.40)
Total Protein: 7.1 g/dL (ref 6.0–8.5)

## 2023-03-16 LAB — LIPID PANEL
Chol/HDL Ratio: 1.9 ratio (ref 0.0–4.4)
Cholesterol, Total: 176 mg/dL (ref 100–199)
HDL: 92 mg/dL (ref >39)
LDL Chol Calc (NIH): 71 mg/dL (ref 0–99)
Triglycerides: 67 mg/dL (ref 0–149)
VLDL Cholesterol Cal: 13 mg/dL (ref 5–40)

## 2023-03-16 LAB — LIPOPROTEIN A (LPA): Lipoprotein (a): 85.8 nmol/L — ABNORMAL HIGH (ref <75.0)

## 2023-03-16 NOTE — Telephone Encounter (Signed)
Just an FYI. We have made several attempts to contact this patient including sending a letter to schedule or reschedule their echocardiogram. We will be removing the patient from the echo WQ.   03/02/23 MAILED LETTER [facility]  02/25/23 [facility] to schedule @ 10:05/[facility]  02/18/23 [facility] to reschedule @ 10:08/[facility]  04/19/24Pt left before having echo and will call back to reschduleper DM/[facility]      Thank you

## 2023-03-18 ENCOUNTER — Telehealth: Payer: Self-pay | Admitting: *Deleted

## 2023-03-18 DIAGNOSIS — E785 Hyperlipidemia, unspecified: Secondary | ICD-10-CM

## 2023-03-18 MED ORDER — ATORVASTATIN CALCIUM 40 MG PO TABS: 40.0000 mg | ORAL_TABLET | Freq: Every day | ORAL | 3 refills | Status: DC

## 2023-03-18 NOTE — Telephone Encounter (Signed)
Reviewed w patient.  She will increase to atorvastatin 40 mg daily and return for labs 05/23/23.

## 2023-03-18 NOTE — Telephone Encounter (Signed)
-----   Message from Orbie Pyo, MD sent at 03/16/2023  2:50 PM EDT ----- Increase atorva to 40; lipds/lfts in 2 months.

## 2023-04-12 DIAGNOSIS — H00015 Hordeolum externum left lower eyelid: Secondary | ICD-10-CM | POA: Diagnosis not present

## 2023-04-15 DIAGNOSIS — Z01419 Encounter for gynecological examination (general) (routine) without abnormal findings: Secondary | ICD-10-CM | POA: Diagnosis not present

## 2023-04-15 DIAGNOSIS — Z6820 Body mass index (BMI) 20.0-20.9, adult: Secondary | ICD-10-CM | POA: Diagnosis not present

## 2023-04-15 DIAGNOSIS — Z1231 Encounter for screening mammogram for malignant neoplasm of breast: Secondary | ICD-10-CM | POA: Diagnosis not present

## 2023-04-20 ENCOUNTER — Other Ambulatory Visit: Payer: Self-pay | Admitting: Obstetrics and Gynecology

## 2023-04-20 DIAGNOSIS — R928 Other abnormal and inconclusive findings on diagnostic imaging of breast: Secondary | ICD-10-CM

## 2023-04-29 ENCOUNTER — Other Ambulatory Visit: Payer: Self-pay | Admitting: Obstetrics and Gynecology

## 2023-04-29 ENCOUNTER — Ambulatory Visit
Admission: RE | Admit: 2023-04-29 | Discharge: 2023-04-29 | Disposition: A | Discharge status: Home / Self Care | Payer: BC Managed Care – PPO | Source: Ambulatory Visit | Attending: Obstetrics and Gynecology | Admitting: Obstetrics and Gynecology

## 2023-04-29 DIAGNOSIS — R928 Other abnormal and inconclusive findings on diagnostic imaging of breast: Secondary | ICD-10-CM

## 2023-04-29 DIAGNOSIS — N6489 Other specified disorders of breast: Secondary | ICD-10-CM

## 2023-05-05 ENCOUNTER — Ambulatory Visit
Admission: RE | Admit: 2023-05-05 | Discharge: 2023-05-05 | Disposition: A | Discharge status: Home / Self Care | Payer: BC Managed Care – PPO | Source: Ambulatory Visit | Attending: Obstetrics and Gynecology | Admitting: Obstetrics and Gynecology

## 2023-05-05 DIAGNOSIS — R928 Other abnormal and inconclusive findings on diagnostic imaging of breast: Secondary | ICD-10-CM | POA: Diagnosis not present

## 2023-05-05 DIAGNOSIS — N6489 Other specified disorders of breast: Secondary | ICD-10-CM

## 2023-05-05 DIAGNOSIS — N6031 Fibrosclerosis of right breast: Secondary | ICD-10-CM | POA: Diagnosis not present

## 2023-05-05 HISTORY — PX: BREAST BIOPSY: SHX20

## 2023-05-10 DIAGNOSIS — H2513 Age-related nuclear cataract, bilateral: Secondary | ICD-10-CM | POA: Diagnosis not present

## 2023-06-13 DIAGNOSIS — E039 Hypothyroidism, unspecified: Secondary | ICD-10-CM | POA: Diagnosis not present

## 2023-06-20 DIAGNOSIS — E039 Hypothyroidism, unspecified: Secondary | ICD-10-CM | POA: Diagnosis not present

## 2023-07-25 DIAGNOSIS — E039 Hypothyroidism, unspecified: Secondary | ICD-10-CM | POA: Diagnosis not present

## 2023-07-25 DIAGNOSIS — Z0000 Encounter for general adult medical examination without abnormal findings: Secondary | ICD-10-CM | POA: Diagnosis not present

## 2023-07-25 DIAGNOSIS — Z23 Encounter for immunization: Secondary | ICD-10-CM | POA: Diagnosis not present

## 2023-07-25 DIAGNOSIS — E559 Vitamin D deficiency, unspecified: Secondary | ICD-10-CM | POA: Diagnosis not present

## 2023-07-25 DIAGNOSIS — E785 Hyperlipidemia, unspecified: Secondary | ICD-10-CM | POA: Diagnosis not present

## 2023-08-22 DIAGNOSIS — E039 Hypothyroidism, unspecified: Secondary | ICD-10-CM | POA: Diagnosis not present

## 2023-08-25 DIAGNOSIS — J029 Acute pharyngitis, unspecified: Secondary | ICD-10-CM | POA: Diagnosis not present

## 2023-10-10 DIAGNOSIS — Z85828 Personal history of other malignant neoplasm of skin: Secondary | ICD-10-CM | POA: Diagnosis not present

## 2023-10-10 DIAGNOSIS — L918 Other hypertrophic disorders of the skin: Secondary | ICD-10-CM | POA: Diagnosis not present

## 2023-10-10 DIAGNOSIS — D2262 Melanocytic nevi of left upper limb, including shoulder: Secondary | ICD-10-CM | POA: Diagnosis not present

## 2023-10-10 DIAGNOSIS — D225 Melanocytic nevi of trunk: Secondary | ICD-10-CM | POA: Diagnosis not present

## 2024-03-10 ENCOUNTER — Other Ambulatory Visit: Payer: Self-pay | Admitting: Internal Medicine

## 2024-03-12 ENCOUNTER — Ambulatory Visit (INDEPENDENT_AMBULATORY_CARE_PROVIDER_SITE_OTHER): Payer: BC Managed Care – PPO | Admitting: Otolaryngology

## 2024-04-11 ENCOUNTER — Other Ambulatory Visit: Payer: Self-pay | Admitting: Internal Medicine

## 2024-04-27 ENCOUNTER — Other Ambulatory Visit: Payer: Self-pay | Admitting: Internal Medicine

## 2024-05-16 ENCOUNTER — Other Ambulatory Visit: Payer: Self-pay | Admitting: Internal Medicine

## 2024-07-31 ENCOUNTER — Other Ambulatory Visit (HOSPITAL_COMMUNITY): Payer: Self-pay | Admitting: Physician Assistant

## 2024-07-31 DIAGNOSIS — E785 Hyperlipidemia, unspecified: Secondary | ICD-10-CM

## 2024-08-20 ENCOUNTER — Ambulatory Visit (HOSPITAL_BASED_OUTPATIENT_CLINIC_OR_DEPARTMENT_OTHER)
Admission: RE | Admit: 2024-08-20 | Discharge: 2024-08-20 | Disposition: A | Discharge status: Home / Self Care | Payer: Self-pay | Source: Ambulatory Visit | Attending: Physician Assistant | Admitting: Physician Assistant

## 2024-08-20 DIAGNOSIS — E785 Hyperlipidemia, unspecified: Secondary | ICD-10-CM | POA: Insufficient documentation
